# Patient Record
Sex: Female | Born: 1990
Health system: Southern US, Community
[De-identification: ages and names within clinical notes are randomized; demographics above are authoritative.]

## PROBLEM LIST (undated history)

## (undated) ENCOUNTER — Inpatient Hospital Stay: Payer: Self-pay

---

## 2015-06-11 ENCOUNTER — Other Ambulatory Visit: Payer: Self-pay | Admitting: Family Medicine

## 2015-06-11 DIAGNOSIS — R319 Hematuria, unspecified: Secondary | ICD-10-CM

## 2015-06-20 ENCOUNTER — Other Ambulatory Visit: Payer: Self-pay

## 2015-06-30 ENCOUNTER — Ambulatory Visit
Admission: RE | Admit: 2015-06-30 | Discharge: 2015-06-30 | Disposition: A | Discharge status: Home / Self Care | Payer: BLUE CROSS/BLUE SHIELD | Source: Ambulatory Visit | Attending: Family Medicine | Admitting: Family Medicine

## 2015-06-30 DIAGNOSIS — R319 Hematuria, unspecified: Secondary | ICD-10-CM

## 2015-12-11 IMAGING — CT CT ABD-PELV W/O CM
3 of 4 series · 8 of 46 positions shown, 15 images · non-contrast
Comparison: None.

CLINICAL DATA: Left lower back pain and rt flank pain Hematuria,
intermittently since 05/26/15. Mid supra pubic pain. Symptoms X 1
month. No surg. No hx stones

EXAM:
CT ABDOMEN AND PELVIS WITHOUT CONTRAST
TECHNIQUE: Multidetector CT imaging of the abdomen and pelvis was performed
following the standard protocol without IV contrast.

[Series 3: cor · coronal · 0.69mm/px · 3 of 59 slices shown, 4 images]
[im 20/59  soft-tissue]
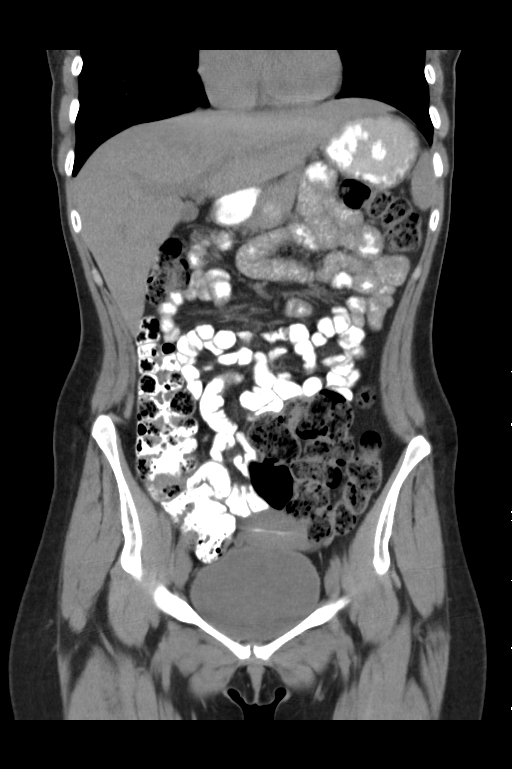
[im 26/59  soft-tissue]
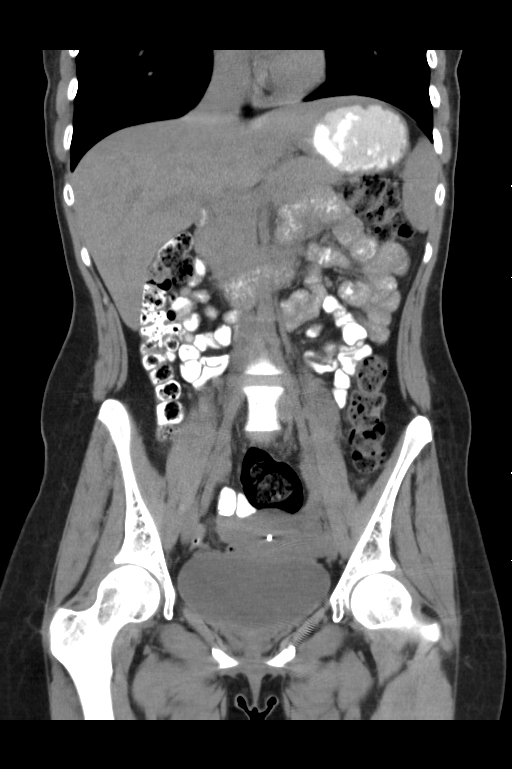
[im 26/59  bone]
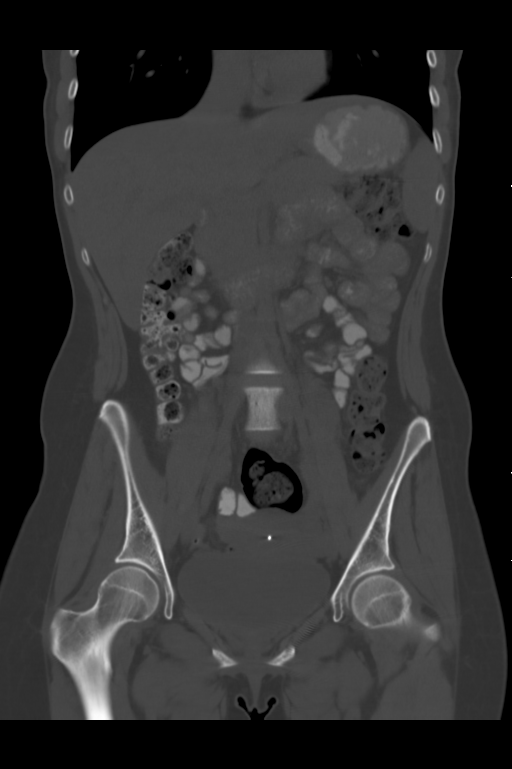
[im 33/59  soft-tissue]
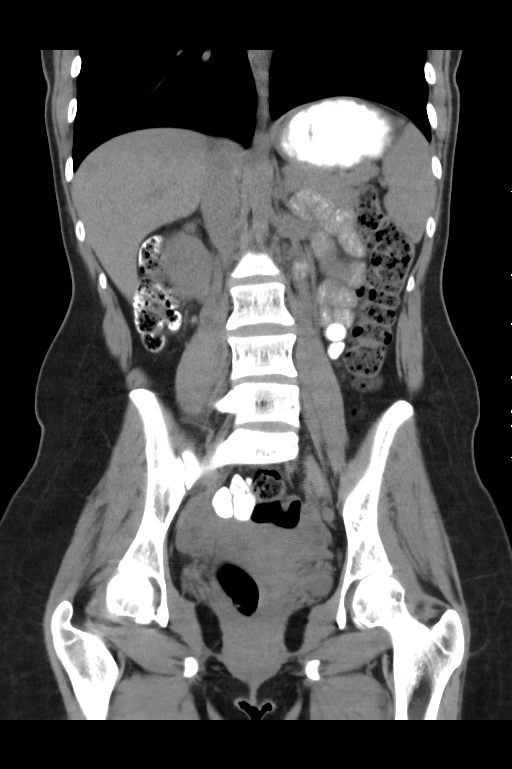

[Series 4: sag · sagittal · 0.51mm/px · 1 of 97 slices shown, 2 images]
[im 33/97  soft-tissue]
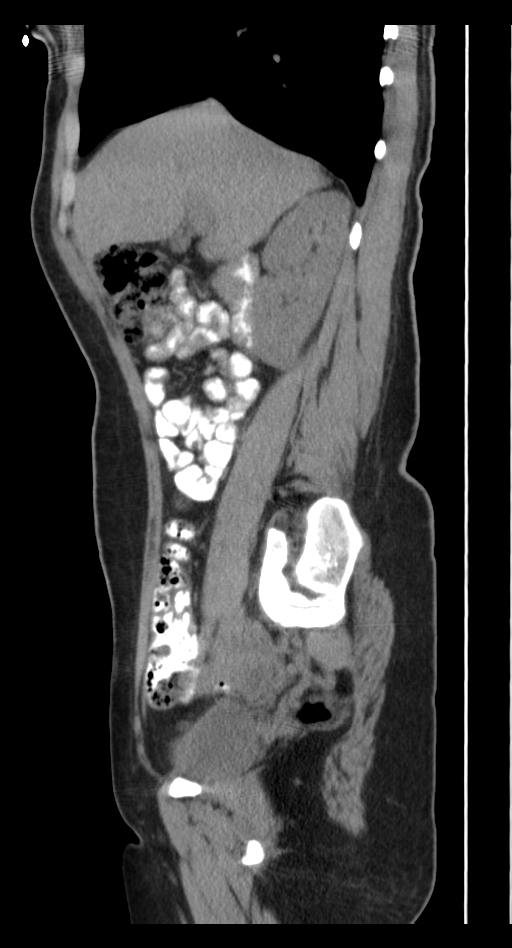
[im 33/97  bone]
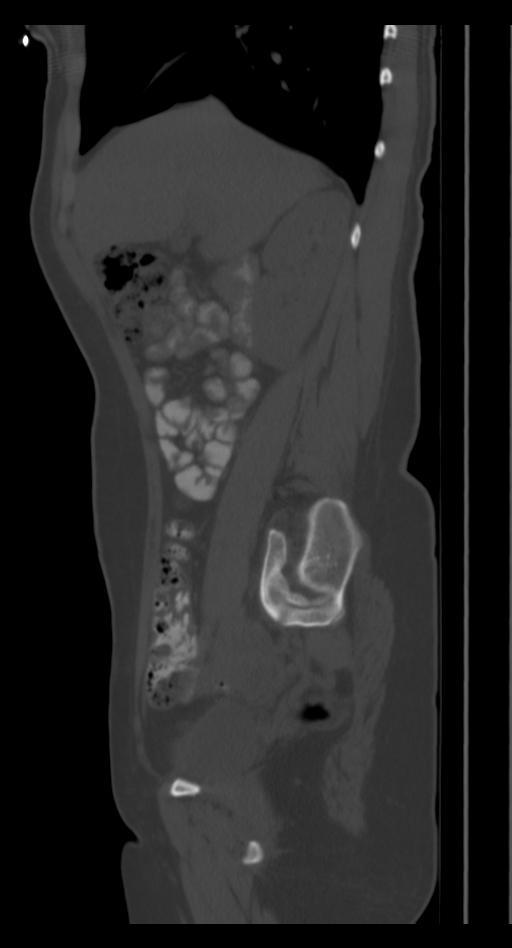

[Series 5: lung · axial · 0.66mm/px · z∈[-131,-71]mm · 4 of 20 slices shown, 9 images]
[im 4/20  soft-tissue]
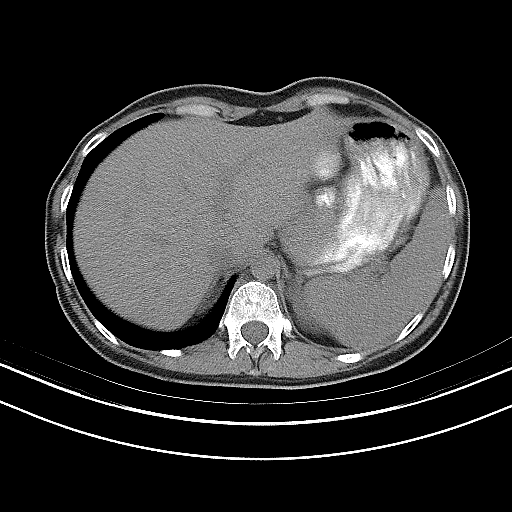
[im 4/20  lung]
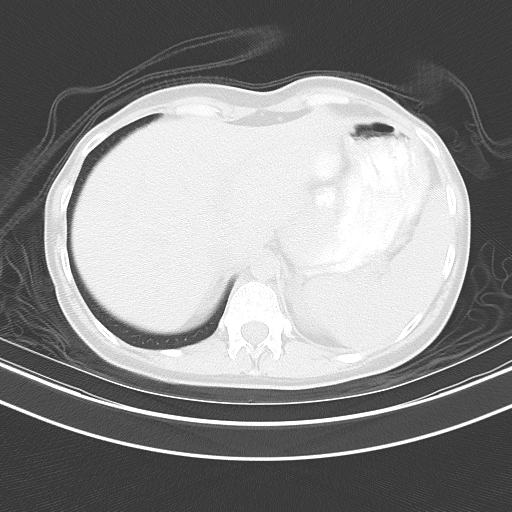
[im 4/20  bone]
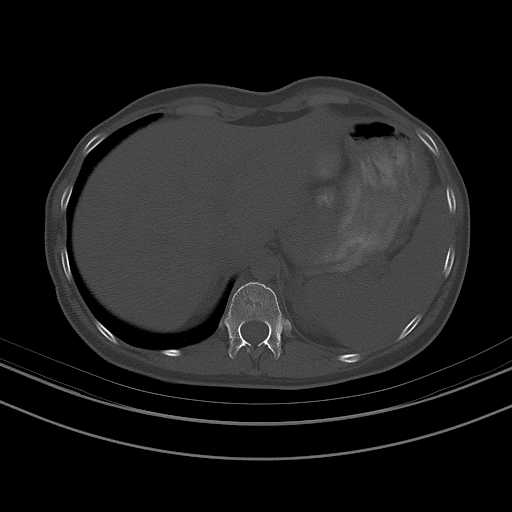
[im 8/20  soft-tissue]
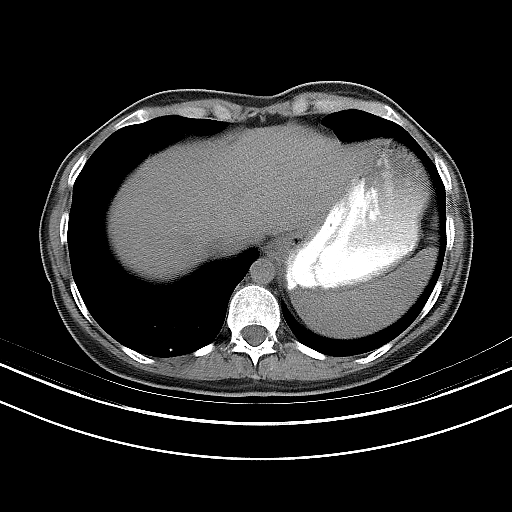
[im 8/20  lung]
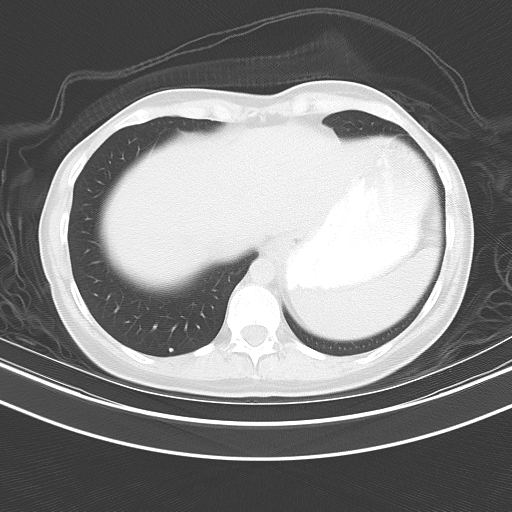
[im 12/20  soft-tissue]
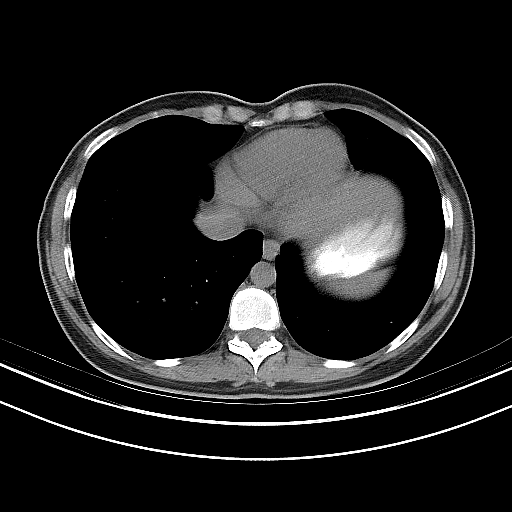
[im 12/20  lung]
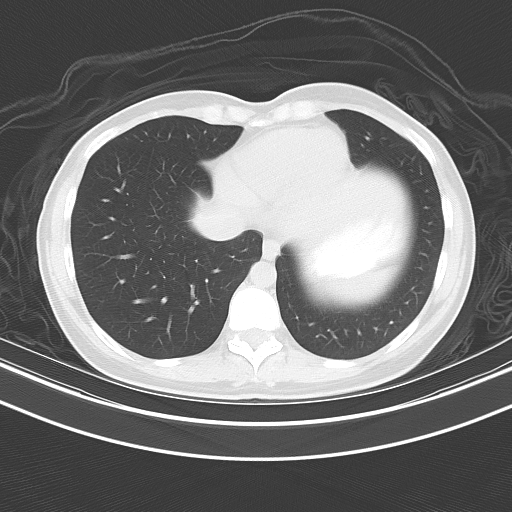
[im 16/20  soft-tissue]
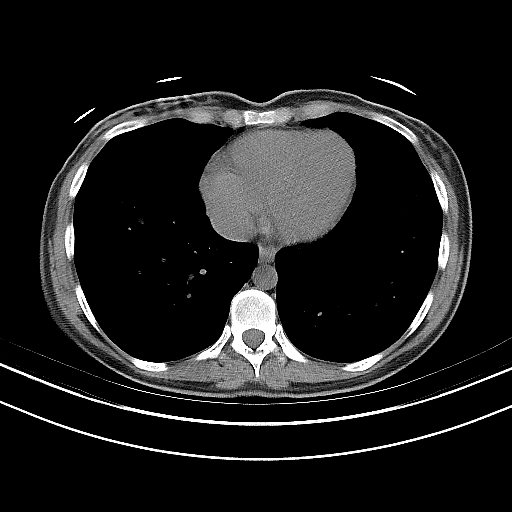
[im 16/20  lung]
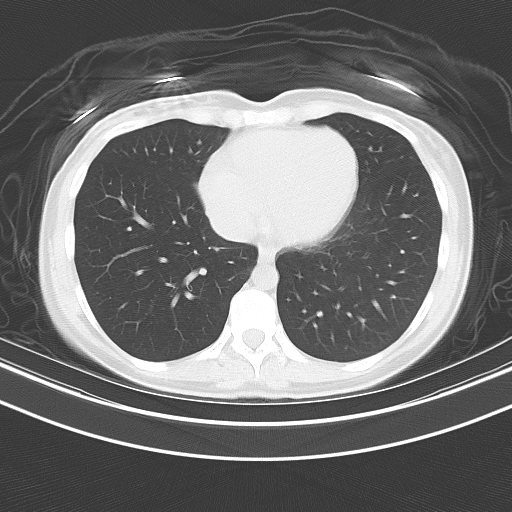

[8 of 46 positions shown; findings below may reference images not displayed]

FINDINGS: Lung bases: Small calcified granuloma in the right lower lobe. Lung
bases otherwise clear. Heart normal in size.

Liver, spleen, gallbladder, pancreas, adrenal glands:  Normal.

Kidneys, ureters, bladder: Normal. No renal or ureteral stones. No
evidence of obstructive uropathy.

Uterus and adnexa: Well-positioned IUD in the uterus. No uterine
masses. No adnexal masses.

Lymph nodes:  No adenopathy.

Ascites:  None.

Gastrointestinal: Mild generalized increased stool noted throughout
the colon and in the rectum. No colonic wall thickening or
inflammatory changes. Normal stomach and small bowel. Normal
appendix visualized.

Musculoskeletal:  Unremarkable.
IMPRESSION: 1. No acute findings.
2. No evidence of renal or ureteral stones or obstructive uropathy.
3. Mild generalized increased stool noted throughout the colon and
in the rectum. Otherwise unremarkable.

## 2017-11-17 ENCOUNTER — Other Ambulatory Visit: Payer: Self-pay | Admitting: Nurse Practitioner

## 2017-11-17 ENCOUNTER — Other Ambulatory Visit (HOSPITAL_COMMUNITY)
Admission: RE | Admit: 2017-11-17 | Discharge: 2017-11-17 | Disposition: A | Discharge status: Home / Self Care | Payer: BLUE CROSS/BLUE SHIELD | Source: Ambulatory Visit | Attending: Obstetrics and Gynecology | Admitting: Obstetrics and Gynecology

## 2017-11-17 DIAGNOSIS — Z01419 Encounter for gynecological examination (general) (routine) without abnormal findings: Secondary | ICD-10-CM | POA: Diagnosis present

## 2017-11-22 LAB — CYTOLOGY - PAP: DIAGNOSIS: NEGATIVE

## 2017-11-29 ENCOUNTER — Ambulatory Visit (HOSPITAL_COMMUNITY)
Admission: EM | Admit: 2017-11-29 | Discharge: 2017-11-29 | Disposition: A | Discharge status: Home / Self Care | Payer: BLUE CROSS/BLUE SHIELD | Attending: Family Medicine | Admitting: Family Medicine

## 2017-11-29 ENCOUNTER — Encounter (HOSPITAL_COMMUNITY): Payer: Self-pay | Admitting: Emergency Medicine

## 2017-11-29 DIAGNOSIS — S61012A Laceration without foreign body of left thumb without damage to nail, initial encounter: Secondary | ICD-10-CM

## 2017-11-29 DIAGNOSIS — Z23 Encounter for immunization: Secondary | ICD-10-CM

## 2017-11-29 DIAGNOSIS — W260XXA Contact with knife, initial encounter: Secondary | ICD-10-CM

## 2017-11-29 DIAGNOSIS — R55 Syncope and collapse: Secondary | ICD-10-CM | POA: Diagnosis not present

## 2017-11-29 MED ORDER — TETANUS-DIPHTH-ACELL PERTUSSIS 5-2.5-18.5 LF-MCG/0.5 IM SUSP
INTRAMUSCULAR | Status: AC
  Filled 2017-11-29: qty 0.5

## 2017-11-29 MED ORDER — LIDOCAINE-EPINEPHRINE (PF) 2 %-1:200000 IJ SOLN
INTRAMUSCULAR | Status: AC
  Filled 2017-11-29: qty 20

## 2017-11-29 MED ORDER — TETANUS-DIPHTH-ACELL PERTUSSIS 5-2.5-18.5 LF-MCG/0.5 IM SUSP
0.5000 mL | Freq: Once | INTRAMUSCULAR | Status: AC
  Administered 2017-11-29: 0.5 mL via INTRAMUSCULAR

## 2017-11-29 NOTE — ED Triage Notes (Signed)
Pt c/o hand laceration today on L hand, cut it with a knife, pt states she passed out due to seeing the blood. Hand is wrapped.

## 2017-11-29 NOTE — ED Provider Notes (Signed)
[facility]   [id number] 11/29/17 Arrival Time: 1425   SUBJECTIVE:  Amber Herrera is a 27 y.o. female who presents to the urgent care with complaint of hand laceration today on L hand, cut it with a knife, pt states she passed out due to seeing the blood. Hand is wrapped.    History reviewed. No pertinent past medical history. No family history on file. Social History   Socioeconomic History  . Marital status: Married    Spouse name: Not on file  . Number of children: Not on file  . Years of education: Not on file  . Highest education level: Not on file  Occupational History  . Not on file  Social Needs  . Financial resource strain: Not on file  . Food insecurity:    Worry: Not on file    Inability: Not on file  . Transportation needs:    Medical: Not on file    Non-medical: Not on file  Tobacco Use  . Smoking status: Never Smoker  Substance and Sexual Activity  . Alcohol use: Never    Frequency: Never  . Drug use: Never  . Sexual activity: Not on file  Lifestyle  . Physical activity:    Days per week: Not on file    Minutes per session: Not on file  . Stress: Not on file  Relationships  . Social connections:    Talks on phone: Not on file    Gets together: Not on file    Attends religious service: Not on file    Active member of club or organization: Not on file    Attends meetings of clubs or organizations: Not on file    Relationship status: Not on file  . Intimate partner violence:    Fear of current or ex partner: Not on file    Emotionally abused: Not on file    Physically abused: Not on file    Forced sexual activity: Not on file  Other Topics Concern  . Not on file  Social History Narrative  . Not on file   No outpatient medications have been marked as taking for the 11/29/17 encounter [facility] Encounter).   No Known Allergies    ROS: As per HPI, remainder of ROS negative.   OBJECTIVE:   Vitals:   11/29/17 1439  BP: 108/75    Pulse: 60  Resp: 18  Temp: 97.6 F (36.4 C)  SpO2: 100%     General appearance: alert; no distress Eyes: PERRL; EOMI; conjunctiva normal HENT: normocephalic; atraumatic; oral mucosa normal Neck: supple Extremities: no cyanosis or edema; symmetrical with no gross deformities Skin: warm and dry, 2 cm laceration volar base of thumb on thenar eminence Neurologic: normal gait; grossly normal Psychological: alert and cooperative; normal mood and affect      Labs:  Results for orders placed or performed in visit on 11/17/17  Cytology - PAP  Result Value Ref Range   Adequacy      Satisfactory for evaluation  endocervical/transformation zone component PRESENT.   Diagnosis      NEGATIVE FOR INTRAEPITHELIAL LESIONS OR MALIGNANCY. BENIGN REACTIVE/REPARATIVE CHANGES.   Material Submitted CervicoVaginal Pap [ThinPrep Imaged]     Labs Reviewed - No data to display  No results found.     ASSESSMENT & PLAN:  1. Laceration of left thumb without foreign body without damage to nail, initial encounter    Return for suture removal here or your doctor in 1 week.    Keep  the wound dry until tomorrow, then wash as you normally would.  Return for increasing pain, swelling or redness. Meds ordered this encounter  Medications  . Tdap (BOOSTRIX) injection 0.5 mL    Reviewed expectations re: course of current medical issues. Questions answered. Outlined signs and symptoms indicating need for more acute intervention. Patient verbalized understanding. After Visit Summary given.    Procedures:  Laceration site was prepped with Betadine and then anesthetized with 1% Xylocaine and epinephrine.  Wound was closed with two 4-0 Prolene horizontal mattress sutures and then a sterile dressing was applied.  There were no complications     Elvina Sidle, MD 11/29/17 [phone]

## 2017-11-29 NOTE — Discharge Instructions (Addendum)
Return for suture removal here or your doctor in 1 week.    Keep the wound dry until tomorrow, then wash as you normally would.  Return for increasing pain, swelling or redness.

## 2017-12-06 ENCOUNTER — Ambulatory Visit (HOSPITAL_COMMUNITY)
Admission: EM | Admit: 2017-12-06 | Discharge: 2017-12-06 | Disposition: A | Discharge status: Home / Self Care | Payer: BLUE CROSS/BLUE SHIELD

## 2017-12-06 ENCOUNTER — Encounter (HOSPITAL_COMMUNITY): Payer: Self-pay | Admitting: Emergency Medicine

## 2017-12-06 DIAGNOSIS — Z4802 Encounter for removal of sutures: Secondary | ICD-10-CM

## 2017-12-06 DIAGNOSIS — S61012D Laceration without foreign body of left thumb without damage to nail, subsequent encounter: Secondary | ICD-10-CM | POA: Diagnosis not present

## 2017-12-06 NOTE — ED Triage Notes (Signed)
Pt here for suture removal from left hand; pt noted some numbness to thumb and index and middle finger at times while sleeping and pain; bruising noted to palm of hand; KL saw pt to advise

## 2018-01-30 ENCOUNTER — Emergency Department (HOSPITAL_COMMUNITY)
Admission: EM | Admit: 2018-01-30 | Discharge: 2018-01-30 | Disposition: A | Discharge status: Home / Self Care | Payer: BLUE CROSS/BLUE SHIELD | Attending: Emergency Medicine | Admitting: Emergency Medicine

## 2018-01-30 ENCOUNTER — Encounter (HOSPITAL_COMMUNITY): Payer: Self-pay | Admitting: Emergency Medicine

## 2018-01-30 ENCOUNTER — Other Ambulatory Visit: Payer: Self-pay

## 2018-01-30 DIAGNOSIS — H60332 Swimmer's ear, left ear: Secondary | ICD-10-CM

## 2018-01-30 DIAGNOSIS — H9203 Otalgia, bilateral: Secondary | ICD-10-CM | POA: Diagnosis present

## 2018-01-30 DIAGNOSIS — H66003 Acute suppurative otitis media without spontaneous rupture of ear drum, bilateral: Secondary | ICD-10-CM | POA: Insufficient documentation

## 2018-01-30 MED ORDER — OFLOXACIN 0.3 % OP SOLN
5.0000 [drp] | Freq: Every day | OPHTHALMIC | Status: DC
  Administered 2018-01-30: 5 [drp] via OTIC
  Filled 2018-01-30: qty 5

## 2018-01-30 MED ORDER — AMOXICILLIN-POT CLAVULANATE 875-125 MG PO TABS: 1.0000 | ORAL_TABLET | Freq: Two times a day (BID) | ORAL | 0 refills | Status: AC

## 2018-01-30 MED ORDER — AMOXICILLIN-POT CLAVULANATE 875-125 MG PO TABS
1.0000 | ORAL_TABLET | Freq: Once | ORAL | Status: AC
  Administered 2018-01-30: 1 via ORAL
  Filled 2018-01-30: qty 1

## 2018-01-30 MED ORDER — IBUPROFEN 600 MG PO TABS: 600.0000 mg | ORAL_TABLET | Freq: Four times a day (QID) | ORAL | 0 refills | Status: DC | PRN

## 2018-01-30 MED ORDER — ACETAMINOPHEN 500 MG PO TABS
1000.0000 mg | ORAL_TABLET | Freq: Once | ORAL | Status: AC
  Administered 2018-01-30: 1000 mg via ORAL
  Filled 2018-01-30: qty 2

## 2018-01-30 NOTE — ED Triage Notes (Signed)
Pt states she flew in from [place] @ 7 hrs ago, pt c/o bilat ear pressure and ringing.  Worse last 3 hours, unrelieved by PTC ,meds

## 2018-01-30 NOTE — ED Provider Notes (Signed)
[facility] EMERGENCY DEPARTMENT Provider Note  CSN: [id number] Arrival date & time: 01/30/18 0104  Chief Complaint(s) Ear Fullness  HPI Amber Herrera is a 27 y.o. female who presents to the emergency department with bilateral otalgia that began approximately 7 hours prior to arrival.  Patient reports that she returned from a trip to [place] yesterday evening and noted that the pain began while on the flight and gradually worsened since onset.  Patient endorses bilateral hearing difficulty.  There is no alleviating or aggravating factors.  She is tried taking 400 mg of Motrin without relief.  Denies any fevers but endorses chills.  No rhinorrhea or cough.  No nuchal rigidity.  No headache.  Denies any other physical complaints.  Patient reports that they went swimming at the beach yesterday.  Of note patient was seen in the pediatric ED and diagnosed with a left otitis media.  HPI  Past Medical History History reviewed. No pertinent past medical history. There are no active problems to display for this patient.  Home Medication(s) Prior to Admission medications   Medication Sig Start Date End Date Taking? Authorizing Provider  amoxicillin-clavulanate (AUGMENTIN) 875-125 MG tablet Take 1 tablet by mouth 2 (two) times daily for 10 days. 01/30/18 02/09/18  Nira Conn, MD  ibuprofen (ADVIL,MOTRIN) 600 MG tablet Take 1 tablet (600 mg total) by mouth every 6 (six) hours as needed. 01/30/18   Nira Conn, MD                                                                                                                                    Past Surgical History History reviewed. No pertinent surgical history. Family History No family history on file.  Social History Social History   Tobacco Use  . Smoking status: Never Smoker  . Smokeless tobacco: Never Used  Substance Use Topics  . Alcohol use: Never    Frequency: Never  . Drug use: Never    Allergies Patient has no known allergies.  Review of Systems Review of Systems All other systems are reviewed and are negative for acute change except as noted in the HPI  Physical Exam Vital Signs  I have reviewed the triage vital signs BP 115/69 (BP Location: Right Arm)   Pulse 94   Temp 97.9 F (36.6 C) (Oral)   Resp 18   SpO2 97%   Physical Exam  Constitutional: She is oriented to person, place, and time. She appears well-developed and well-nourished. No distress.  HENT:  Head: Normocephalic and atraumatic.  Right Ear: No mastoid tenderness. Tympanic membrane is erythematous and bulging. A middle ear effusion is present.  Left Ear: No mastoid tenderness. Tympanic membrane is erythematous and bulging. A middle ear effusion is present.  Nose: Nose normal.  Granulated tissue and left external canal.  Eyes: Pupils are equal, round, and reactive to light. Conjunctivae and EOM are normal. Right eye exhibits no  discharge. Left eye exhibits no discharge. No scleral icterus.  Neck: Normal range of motion. Neck supple.  Cardiovascular: Normal rate and regular rhythm. Exam reveals no gallop and no friction rub.  No murmur heard. Pulmonary/Chest: Effort normal and breath sounds normal. No stridor. No respiratory distress. She has no rales.  Abdominal: Soft. She exhibits no distension. There is no tenderness.  Musculoskeletal: She exhibits no edema or tenderness.  Neurological: She is alert and oriented to person, place, and time.  Skin: Skin is warm and dry. No rash noted. She is not diaphoretic. No erythema.  Psychiatric: She has a normal mood and affect.  Vitals reviewed.   ED Results and Treatments Labs (all labs ordered are listed, but only abnormal results are displayed) Labs Reviewed - No data to display                                                                                                                       EKG  EKG Interpretation  Date/Time:     Ventricular Rate:    PR Interval:    QRS Duration:   QT Interval:    QTC Calculation:   R Axis:     Text Interpretation:        Radiology No results found. Pertinent labs & imaging results that were available during my care of the patient were reviewed by me and considered in my medical decision making (see chart for details).  Medications Ordered in ED Medications  ofloxacin (OCUFLOX) 0.3 % ophthalmic solution 5 drop (has no administration in time range)  acetaminophen (TYLENOL) tablet 1,000 mg (1,000 mg Oral Given 01/30/18 0223)  amoxicillin-clavulanate (AUGMENTIN) 875-125 MG per tablet 1 tablet (1 tablet Oral Given 01/30/18 0223)                                                                                                                                    Procedures Procedures  (including critical care time)  Medical Decision Making / ED Course I have reviewed the nursing notes for this encounter and the patient's prior records (if available in EHR or on provided paperwork).    Presentation consistent with bilateral otitis media as well as otitis externa of the left.  No mastoid tenderness concerning for mastoiditis.  No nuchal rigidity or headache concerning for meningitis.  The patient appears reasonably screened and/or stabilized for discharge and I doubt any other medical condition  or other [facility] requiring further screening, evaluation, or treatment in the ED at this time prior to discharge.  The patient is safe for discharge with strict return precautions.   Final Clinical Impression(s) / ED Diagnoses Final diagnoses:  Non-recurrent acute suppurative otitis media of both ears without spontaneous rupture of tympanic membranes  Acute swimmer's ear of left side    Disposition: Discharge  Condition: Good  I have discussed the results, Dx and Tx plan with the patient who expressed understanding and agree(s) with the plan. Discharge instructions discussed at great  length. The patient was given strict return precautions who verbalized understanding of the instructions. No further questions at time of discharge.    ED Discharge Orders        Ordered    amoxicillin-clavulanate (AUGMENTIN) 875-125 MG tablet  2 times daily     01/30/18 0228    ibuprofen (ADVIL,MOTRIN) 600 MG tablet  Every 6 hours PRN     01/30/18 0228       Follow Up: Primary care provider   in 5-7 days, If symptoms do not improve or  worsen     This chart was dictated using voice recognition software.  Despite best efforts to proofread,  errors can occur which can change the documentation meaning.   Nira Connardama, Trinidad Ingle Eduardo, MD 01/30/18 [phone]

## 2018-04-18 LAB — OB RESULTS CONSOLE HGB/HCT, BLOOD
HEMATOCRIT: 37
HEMOGLOBIN: 12.2

## 2018-04-18 LAB — OB RESULTS CONSOLE ABO/RH: RH Type: POSITIVE

## 2018-04-18 LAB — OB RESULTS CONSOLE GC/CHLAMYDIA
Chlamydia: NEGATIVE
GC PROBE AMP, GENITAL: NEGATIVE

## 2018-04-18 LAB — OB RESULTS CONSOLE HIV ANTIBODY (ROUTINE TESTING): HIV: NONREACTIVE

## 2018-04-18 LAB — HIV ANTIBODY (ROUTINE TESTING W REFLEX)

## 2018-04-18 LAB — OB RESULTS CONSOLE TSH: TSH: 0.284

## 2018-04-18 LAB — OB RESULTS CONSOLE RPR: RPR: NONREACTIVE

## 2018-04-18 LAB — OB RESULTS CONSOLE PLATELET COUNT: Platelets: 284

## 2018-04-18 LAB — OB RESULTS CONSOLE HEPATITIS B SURFACE ANTIGEN: Hepatitis B Surface Ag: NEGATIVE

## 2018-04-18 LAB — OB RESULTS CONSOLE ANTIBODY SCREEN: Antibody Screen: NEGATIVE

## 2018-04-18 LAB — OB RESULTS CONSOLE RUBELLA ANTIBODY, IGM: RUBELLA: IMMUNE

## 2018-04-21 LAB — OB RESULTS CONSOLE VARICELLA ZOSTER ANTIBODY, IGG: Varicella: IMMUNE

## 2018-05-04 ENCOUNTER — Ambulatory Visit (INDEPENDENT_AMBULATORY_CARE_PROVIDER_SITE_OTHER): Payer: BLUE CROSS/BLUE SHIELD | Admitting: Certified Nurse Midwife

## 2018-05-04 ENCOUNTER — Encounter: Payer: Self-pay | Admitting: Certified Nurse Midwife

## 2018-05-04 VITALS — BP 106/72 | HR 86 | Wt 111.2 lb

## 2018-05-04 DIAGNOSIS — Z3491 Encounter for supervision of normal pregnancy, unspecified, first trimester: Secondary | ICD-10-CM

## 2018-05-04 LAB — POCT URINE PREGNANCY: Preg Test, Ur: POSITIVE — AB

## 2018-05-04 NOTE — Patient Instructions (Signed)
WHAT OB PATIENTS CAN EXPECT   Confirmation of pregnancy and ultrasound ordered if medically indicated-[redacted] weeks gestation  New OB (NOB) intake with nurse and New OB (NOB) labs- [redacted] weeks gestation  New OB (NOB) physical examination with provider- 11/[redacted] weeks gestation  Flu vaccine-[redacted] weeks gestation  Anatomy scan-[redacted] weeks gestation  Glucose tolerance test, blood work to test for anemia, T-dap vaccine-[redacted] weeks gestation  Vaginal swabs/cultures-STD/Group B strep-[redacted] weeks gestation  Appointments every 4 weeks until 28 weeks  Every 2 weeks from 28 weeks until 36 weeks  Weekly visits from 36 weeks until delivery  Morning Sickness Morning sickness is when you feel sick to your stomach (nauseous) during pregnancy. You may feel sick to your stomach and throw up (vomit). You may feel sick in the morning, but you can feel this way any time of day. Some women feel very sick to their stomach and cannot stop throwing up (hyperemesis gravidarum). Follow these instructions at home:  Only take medicines as told by your doctor.  Take multivitamins as told by your doctor. Taking multivitamins before getting pregnant can stop or lessen the harshness of morning sickness.  Eat dry toast or unsalted crackers before getting out of bed.  Eat 5 to 6 small meals a day.  Eat dry and bland foods like rice and baked potatoes.  Do not drink liquids with meals. Drink between meals.  Do not eat greasy, fatty, or spicy foods.  Have someone cook for you if the smell of food causes you to feel sick or throw up.  If you feel sick to your stomach after taking prenatal vitamins, take them at night or with a snack.  Eat protein when you need a snack (nuts, yogurt, cheese).  Eat unsweetened gelatins for dessert.  Wear a bracelet used for sea sickness (acupressure wristband).  Go to a doctor that puts thin needles into certain body points (acupuncture) to improve how you feel.  Do not smoke.  Use a  humidifier to keep the air in your house free of odors.  Get lots of fresh air. Contact a doctor if:  You need medicine to feel better.  You feel dizzy or lightheaded.  You are losing weight. Get help right away if:  You feel very sick to your stomach and cannot stop throwing up.  You pass out (faint). This information is not intended to replace advice given to you by your health care provider. Make sure you discuss any questions you have with your health care provider. Document Released: 08/26/2004 Document Revised: 12/25/2015 Document Reviewed: 01/03/2013 Elsevier Interactive Patient Education  2017 [place]. Back Pain in Pregnancy Back pain during pregnancy is common. Back pain may be caused by several factors that are related to changes during your pregnancy. Follow these instructions at home: Managing pain, stiffness, and swelling  If directed, apply ice for sudden (acute) back pain. ? Put ice in a plastic bag. ? Place a towel between your skin and the bag. ? Leave the ice on for 20 minutes, 2-3 times per day.  If directed, apply heat to the affected area before you exercise: ? Place a towel between your skin and the heat pack or heating pad. ? Leave the heat on for 20-30 minutes. ? Remove the heat if your skin turns bright red. This is especially important if you are unable to feel pain, heat, or cold. You may have a greater risk of getting burned. Activity  Exercise as told by your health care provider.  Exercising is the best way to prevent or manage back pain.  Listen to your body when lifting. If lifting hurts, ask for help or bend your knees. This uses your leg muscles instead of your back muscles.  Squat down when picking up something from the floor. Do not bend over.  Only use bed rest as told by your health care provider. Bed rest should only be used for the most severe episodes of back pain. Standing, Sitting, and Lying Down  Do not stand in one place for  long periods of time.  Use good posture when sitting. Make sure your head rests over your shoulders and is not hanging forward. Use a pillow on your lower back if necessary.  Try sleeping on your side, preferably the left side, with a pillow or two between your legs. If you are sore after a night's rest, your bed may be too soft. A firm mattress may provide more support for your back during pregnancy. General instructions  Do not wear high heels.  Eat a healthy diet. Try to gain weight within your health care provider's recommendations.  Use a maternity girdle, elastic sling, or back brace as told by your health care provider.  Take over-the-counter and prescription medicines only as told by your health care provider.  Keep all follow-up visits as told by your health care provider. This is important. This includes any visits with any specialists, such as a physical therapist. Contact a health care provider if:  Your back pain interferes with your daily activities.  You have increasing pain in other parts of your body. Get help right away if:  You develop numbness, tingling, weakness, or problems with the use of your arms or legs.  You develop severe back pain that is not controlled with medicine.  You have a sudden change in bowel or bladder control.  You develop shortness of breath, dizziness, or you faint.  You develop nausea, vomiting, or sweating.  You have back pain that is a rhythmic, cramping pain similar to labor pains. Labor pain is usually 1-2 minutes apart, lasts for about 1 minute, and involves a bearing down feeling or pressure in your pelvis.  You have back pain and your water breaks or you have vaginal bleeding.  You have back pain or numbness that travels down your leg.  Your back pain developed after you fell.  You develop pain on one side of your back.  You see blood in your urine.  You develop skin blisters in the area of your back pain. This  information is not intended to replace advice given to you by your health care provider. Make sure you discuss any questions you have with your health care provider. Document Released: 10/27/2005 Document Revised: 12/25/2015 Document Reviewed: 04/02/2015 Elsevier Interactive Patient Education  2018 [employer]. Common Medications Safe in Pregnancy  Acne:      Constipation:  Benzoyl Peroxide     Colace  Clindamycin      Dulcolax Suppository  Topica Erythromycin     Fibercon  Salicylic Acid      Metamucil         Miralax AVOID:        Senakot   Accutane    Cough:  Retin-A       Cough Drops  Tetracycline      Phenergan w/ Codeine if Rx  Minocycline      Robitussin (Plain & DM)  Antibiotics:     Crabs/Lice:  Ceclor  RID  Cephalosporins    AVOID:  E-Mycins      Kwell  Keflex  Macrobid/Macrodantin   Diarrhea:  Penicillin      Kao-Pectate  Zithromax      Imodium AD         PUSH FLUIDS AVOID:       Cipro     Fever:  Tetracycline      Tylenol (Regular or Extra  Minocycline       Strength)  Levaquin      Extra Strength-Do not          Exceed 8 tabs/24 hrs Caffeine:        <255m/day (equiv. To 1 cup of coffee or  approx. 3 12 oz sodas)         Gas: Cold/Hayfever:       Gas-X  Benadryl      Mylicon  Claritin       Phazyme  **Claritin-D        Chlor-Trimeton    Headaches:  Dimetapp      ASA-Free Excedrin  Drixoral-Non-Drowsy     Cold Compress  Mucinex (Guaifenasin)     Tylenol (Regular or Extra  Sudafed/Sudafed-12 Hour     Strength)  **Sudafed PE Pseudoephedrine   Tylenol Cold & Sinus     Vicks Vapor Rub  Zyrtec  **AVOID if Problems With Blood Pressure         Heartburn: Avoid lying down for at least 1 hour after meals  Aciphex      Maalox     Rash:  Milk of Magnesia     Benadryl    Mylanta       1% Hydrocortisone Cream  Pepcid  Pepcid Complete   Sleep Aids:  Prevacid      Ambien   Prilosec       Benadryl  Rolaids       Chamomile Tea  Tums (Limit  4/day)     Unisom  Zantac       Tylenol PM         Warm milk-add vanilla or  Hemorrhoids:       Sugar for taste  Anusol/Anusol H.C.  (RX: Analapram 2.5%)  Sugar Substitutes:  Hydrocortisone OTC     Ok in moderation  Preparation H      Tucks        Vaseline lotion applied to tissue with wiping    Herpes:     Throat:  Acyclovir      Oragel  Famvir  Valtrex     Vaccines:         Flu Shot Leg Cramps:       *Gardasil  Benadryl      Hepatitis A         Hepatitis B Nasal Spray:       Pneumovax  Saline Nasal Spray     Polio Booster         Tetanus Nausea:       Tuberculosis test or PPD  Vitamin B6 25 mg TID   AVOID:    Dramamine      *Gardasil  Emetrol       Live Poliovirus  Ginger Root 250 mg QID    MMR (measles, mumps &  High Complex Carbs @ Bedtime    rebella)  Sea Bands-Accupressure    Varicella (Chickenpox)  Unisom 1/2 tab TID     *No known complications           If  received before Pain:         Known pregnancy;   Darvocet       Resume series after  Lortab        Delivery  Percocet    Yeast:   Tramadol      Femstat  Tylenol 3      Gyne-lotrimin  Ultram       Monistat  Vicodin           MISC:         All Sunscreens           Hair Coloring/highlights          Insect Repellant's          (Including DEET)         Mystic Tans Second Trimester of Pregnancy The second trimester is from week 13 through week 28, month 4 through 6. This is often the time in pregnancy that you feel your best. Often times, morning sickness has lessened or quit. You may have more energy, and you may get hungry more often. Your unborn baby (fetus) is growing rapidly. At the end of the sixth month, he or she is about 9 inches long and weighs about 1 pounds. You will likely feel the baby move (quickening) between 18 and 20 weeks of pregnancy. Follow these instructions at home:  Avoid all smoking, herbs, and alcohol. Avoid drugs not approved by your doctor.  Do not use any tobacco products, including  cigarettes, chewing tobacco, and electronic cigarettes. If you need help quitting, ask your doctor. You may get counseling or other support to help you quit.  Only take medicine as told by your doctor. Some medicines are safe and some are not during pregnancy.  Exercise only as told by your doctor. Stop exercising if you start having cramps.  Eat regular, healthy meals.  Wear a good support bra if your breasts are tender.  Do not use hot tubs, steam rooms, or saunas.  Wear your seat belt when driving.  Avoid raw meat, uncooked cheese, and liter boxes and soil used by cats.  Take your prenatal vitamins.  Take 1500-2000 milligrams of calcium daily starting at the 20th week of pregnancy until you deliver your baby.  Try taking medicine that helps you poop (stool softener) as needed, and if your doctor approves. Eat more fiber by eating fresh fruit, vegetables, and whole grains. Drink enough fluids to keep your pee (urine) clear or pale yellow.  Take warm water baths (sitz baths) to soothe pain or discomfort caused by hemorrhoids. Use hemorrhoid cream if your doctor approves.  If you have puffy, bulging veins (varicose veins), wear support hose. Raise (elevate) your feet for 15 minutes, 3-4 times a day. Limit salt in your diet.  Avoid heavy lifting, wear low heals, and sit up straight.  Rest with your legs raised if you have leg cramps or low back pain.  Visit your dentist if you have not gone during your pregnancy. Use a soft toothbrush to brush your teeth. Be gentle when you floss.  You can have sex (intercourse) unless your doctor tells you not to.  Go to your doctor visits. Get help if:  You feel dizzy.  You have mild cramps or pressure in your lower belly (abdomen).  You have a nagging pain in your belly area.  You continue to feel sick to your stomach (nauseous), throw up (vomit), or have watery poop (diarrhea).  You have bad smelling fluid coming from  your  vagina.  You have pain with peeing (urination). Get help right away if:  You have a fever.  You are leaking fluid from your vagina.  You have spotting or bleeding from your vagina.  You have severe belly cramping or pain.  You lose or gain weight rapidly.  You have trouble catching your breath and have chest pain.  You notice sudden or extreme puffiness (swelling) of your face, hands, ankles, feet, or legs.  You have not felt the baby move in over an hour.  You have severe headaches that do not go away with medicine.  You have vision changes. This information is not intended to replace advice given to you by your health care provider. Make sure you discuss any questions you have with your health care provider. Document Released: 10/13/2009 Document Revised: 12/25/2015 Document Reviewed: 09/19/2012 Elsevier Interactive Patient Education  2017 [employer].

## 2018-05-04 NOTE — Progress Notes (Signed)
Amber Herrera presents for NOB nurse interview visit. Pregnancy confirmation done [facility] _9/17/19_____.  G-4 .  P- 3   . Pregnancy education material explained and given. _0__ cats in the home. NOB labs ordered.  HIV labs and Drug screen were explained optional and she did not decline. Drug screen ordered/declined. PNV encouraged. Genetic screening options discussed. Genetic testing:Declined.  Pt may discuss with provider. Pt. To follow up with provider in _4_ weeks for ROB.  All questions answered.  TRANSFER IN OB HISTORY AND PHYSICAL  SUBJECTIVE:       Amber Herrera is a 27 y.o. G1P0 female, Patient's last menstrual period was 02/13/2018 (exact date)., Estimated Date of Delivery: 11/13/18, [redacted]w[redacted]d, presents today for Transition of Prenatal Care.   EPIC data migration awaiting receipt of outside records.   Reports intermittent nausea with vomiting. Denies difficulty breathing or respiratory distress, chest pain, abdominal pain, vaginal bleeding, dysuria, and leg pain or swelling.   Accompanied by spouse and youngest daughter.   Gynecologic History  Patient's last menstrual period was 02/13/2018 (exact date).   Contraception: none   Last Pap: 03/2017. Results were: normal per patient report.   Obstetric History  OB History  Gravida Para Term Preterm AB Living  4 3 3     3   SAB TAB Ectopic Multiple Live Births          3    # Outcome Date GA Lbr Len/2nd Weight Sex Delivery Anes PTL Lv  4 Current           3 Term 2016    F Vag-Spont   LIV  2 Term 2012    F Vag-Spont   LIV  1 Term 2008    F Vag-Spont   LIV    Current Outpatient Medications on File Prior to Visit  Medication Sig Dispense Refill  . Prenatal Vit-Fe Fumarate-FA (MULTIVITAMIN-PRENATAL) 27-0.8 MG TABS tablet Take 1 tablet by mouth daily at 12 noon.    Marland Kitchen ibuprofen (ADVIL,MOTRIN) 600 MG tablet Take 1 tablet (600 mg total) by mouth every 6 (six) hours as needed. (Patient not taking: Reported on 05/04/2018) 30 tablet 0    No current facility-administered medications on file prior to visit.     No Known Allergies  Social History   Socioeconomic History  . Marital status: Married    Spouse name: Not on file  . Number of children: Not on file  . Years of education: Not on file  . Highest education level: Not on file  Occupational History  . Not on file  Social Needs  . Financial resource strain: Not on file  . Food insecurity:    Worry: Not on file    Inability: Not on file  . Transportation needs:    Medical: Not on file    Non-medical: Not on file  Tobacco Use  . Smoking status: Never Smoker  . Smokeless tobacco: Never Used  Substance and Sexual Activity  . Alcohol use: Never    Frequency: Never  . Drug use: Never  . Sexual activity: Not on file  Lifestyle  . Physical activity:    Days per week: Not on file    Minutes per session: Not on file  . Stress: Not on file  Relationships  . Social connections:    Talks on phone: Not on file    Gets together: Not on file    Attends religious service: Not on file    Active member of club or organization: Not on  file    Attends meetings of clubs or organizations: Not on file    Relationship status: Not on file  . Intimate partner violence:    Fear of current or ex partner: Not on file    Emotionally abused: Not on file    Physically abused: Not on file    Forced sexual activity: Not on file  Other Topics Concern  . Not on file  Social History Narrative  . Not on file   The following portions of the patient's history were reviewed and updated as appropriate: allergies, current medications, past OB history, past medical history, past surgical history, past family history, past social history, and problem list.    OBJECTIVE:  BP 106/72   Pulse 86   Wt 111 lb 3 oz (50.4 kg)   LMP 02/13/2018 (Exact Date)   Initial Physical Exam (New OB)  GENERAL APPEARANCE: alert, well appearing, in no apparent distress  HEAD: normocephalic,  atraumatic  MOUTH: mucous membranes moist, pharynx normal without lesions  THYROID: not examined  BREASTS: patient declined exam  LUNGS: clear to auscultation, no wheezes, rales or rhonchi, symmetric air entry  HEART: regular rate and rhythm, no murmurs  ABDOMEN: soft, nontender, nondistended, no abnormal masses, no epigastric pain and FHT present  EXTREMITIES: no edema  SKIN: normal coloration and turgor, no rashes  LYMPH NODES: not examined  NEUROLOGIC: alert, oriented, normal speech, no focal findings or movement disorder noted  PELVIC EXAM: deferred, no indicated  ASSESSMENT: Normal pregnancy Declines genetic screening Nausea and vomiting in pregnancy  PLAN: Prenatal care New OB counseling: The patient has been given an overview regarding routine prenatal care. Recommendations regarding diet, weight gain, and exercise in pregnancy were given. Prenatal testing, optional genetic testing, and ultrasound use in pregnancy were reviewed.  Benefits of Breast Feeding were discussed. The patient is encouraged to consider nursing her baby post partum. See orders   Gunnar Bulla, CNM [facility], [facility]

## 2018-05-07 ENCOUNTER — Encounter: Payer: Self-pay | Admitting: Certified Nurse Midwife

## 2018-05-07 MED ORDER — RANITIDINE HCL 150 MG PO CAPS: 150.0000 mg | ORAL_CAPSULE | Freq: Two times a day (BID) | ORAL | 1 refills | Status: DC

## 2018-05-07 MED ORDER — PYRIDOXINE HCL 25 MG PO TABS: 25.0000 mg | ORAL_TABLET | Freq: Three times a day (TID) | ORAL | 3 refills | Status: DC | PRN

## 2018-05-08 ENCOUNTER — Telehealth: Payer: Self-pay

## 2018-05-08 NOTE — Telephone Encounter (Signed)
Called patient to ask about MMR to be signed, pt said she filled out release form while she was here for appointment.  [place]. to reach out to [facility] to verify receipt of MMR.

## 2018-05-09 NOTE — Telephone Encounter (Signed)
Spoke with patient, states she will go to [facility] to sign release form tomorrow 10/9 and she is planning to continue care with [place] and d/c from [facility].  I also called [facility] to see if patient had filled out release form there since she is sure she filled out a release form "some where".  Linda at [facility] denies having release form there.

## 2018-05-30 ENCOUNTER — Ambulatory Visit (INDEPENDENT_AMBULATORY_CARE_PROVIDER_SITE_OTHER): Payer: BLUE CROSS/BLUE SHIELD | Admitting: Certified Nurse Midwife

## 2018-05-30 VITALS — BP 93/56 | HR 86

## 2018-05-30 DIAGNOSIS — Z3482 Encounter for supervision of other normal pregnancy, second trimester: Secondary | ICD-10-CM

## 2018-05-30 LAB — POCT URINALYSIS DIPSTICK OB
Bilirubin, UA: NEGATIVE
Blood, UA: NEGATIVE
GLUCOSE, UA: NEGATIVE
Ketones, UA: NEGATIVE
LEUKOCYTES UA: NEGATIVE
NITRITE UA: NEGATIVE
PROTEIN: NEGATIVE
Spec Grav, UA: 1.02 (ref 1.010–1.025)
Urobilinogen, UA: 0.2 E.U./dL
pH, UA: 6 (ref 5.0–8.0)

## 2018-05-30 NOTE — Patient Instructions (Signed)

## 2018-05-30 NOTE — Progress Notes (Signed)
ROB doing well . Complains of round ligament pain . Reassurance given. She is not feeling movement yet. Movement heard with doppler. Discussed anatomy u/s at next visit. She asks about traveling in pregnancy, she will be going out of town for Thanksgiving. Encourage PO hydration, snacks, and moving q 2-3 hrs. She verbalizes understanding. Follow up 4 wks for anatomy scan and ROB with Melody.   Doreene Burke, CNM

## 2018-06-13 ENCOUNTER — Other Ambulatory Visit: Payer: Self-pay | Admitting: *Deleted

## 2018-06-13 ENCOUNTER — Telehealth: Payer: Self-pay | Admitting: Certified Nurse Midwife

## 2018-06-13 MED ORDER — POLYETHYLENE GLYCOL 3350 17 G PO PACK: 17.0000 g | PACK | Freq: Every day | ORAL | 0 refills | Status: DC

## 2018-06-13 NOTE — Telephone Encounter (Signed)
Sent pt in some miralax

## 2018-06-13 NOTE — Telephone Encounter (Signed)
The patient called stating she is constipated and needs something called into the pharmacy please advise, thanks.

## 2018-06-21 ENCOUNTER — Encounter: Payer: Self-pay | Admitting: Certified Nurse Midwife

## 2018-06-21 ENCOUNTER — Ambulatory Visit (INDEPENDENT_AMBULATORY_CARE_PROVIDER_SITE_OTHER): Payer: BLUE CROSS/BLUE SHIELD | Admitting: Certified Nurse Midwife

## 2018-06-21 ENCOUNTER — Ambulatory Visit (INDEPENDENT_AMBULATORY_CARE_PROVIDER_SITE_OTHER): Payer: BLUE CROSS/BLUE SHIELD

## 2018-06-21 VITALS — BP 106/53 | HR 77 | Ht 66.0 in | Wt 123.8 lb

## 2018-06-21 DIAGNOSIS — Z3482 Encounter for supervision of other normal pregnancy, second trimester: Secondary | ICD-10-CM | POA: Diagnosis not present

## 2018-06-21 DIAGNOSIS — M549 Dorsalgia, unspecified: Secondary | ICD-10-CM

## 2018-06-21 DIAGNOSIS — Z23 Encounter for immunization: Secondary | ICD-10-CM

## 2018-06-21 DIAGNOSIS — O9989 Other specified diseases and conditions complicating pregnancy, childbirth and the puerperium: Secondary | ICD-10-CM

## 2018-06-21 DIAGNOSIS — Z3A19 19 weeks gestation of pregnancy: Secondary | ICD-10-CM

## 2018-06-21 LAB — POCT URINALYSIS DIPSTICK OB
Bilirubin, UA: NEGATIVE
Blood, UA: NEGATIVE
GLUCOSE, UA: NEGATIVE
Ketones, UA: NEGATIVE
LEUKOCYTES UA: NEGATIVE
Nitrite, UA: NEGATIVE
POC,PROTEIN,UA: NEGATIVE
SPEC GRAV UA: 1.01 (ref 1.010–1.025)
Urobilinogen, UA: 0.2 E.U./dL
pH, UA: 5 (ref 5.0–8.0)

## 2018-06-21 MED ORDER — POLYETHYLENE GLYCOL 3350 17 GM/SCOOP PO POWD: 1.0000 | Freq: Once | ORAL | 0 refills | Status: AC

## 2018-06-21 MED ORDER — RANITIDINE HCL 150 MG PO TABS: 150.0000 mg | ORAL_TABLET | Freq: Two times a day (BID) | ORAL | 0 refills | Status: DC

## 2018-06-21 MED ORDER — PYRIDOXINE HCL 25 MG PO TABS: 25.0000 mg | ORAL_TABLET | Freq: Four times a day (QID) | ORAL | 3 refills | Status: DC | PRN

## 2018-06-21 NOTE — Progress Notes (Signed)
ROB doing well. Anatomy scan today incomplete, questionable 2 vessel cord vs 3 vessel cord . Additional scanning needed to get complete pictures of RBOT and cord. Pt verbalizes understanding and agrees to plan. To follow up 2 wk for u/s and 4 wk for ROB. Pt has back pain. States she had bad back pain with last pregnancy and states it is starting . Encouraged belly band and use of tylenol. She request referral to chiropractor. Order placed. ROB in 4 wks.   Doreene BurkeAnnie Dahiana Kulak, CNM

## 2018-06-21 NOTE — Patient Instructions (Signed)

## 2018-06-21 NOTE — Progress Notes (Signed)
Patient Name: Vance GatherMarwa Landino DOB: 1990/09/28 MRN: [id number]  ULTRASOUND REPORT  Location: [facility] Date of Service: 06/21/2018   Indications:Anatomy Ultrasound Findings:  Mason JimSingleton intrauterine pregnancy is visualized with FHR at 144 BPM. Biometrics give an (U/S) Gestational age of 3742w3d and an (U/S) EDD of 11/12/18; this correlates with the clinically established Estimated Date of Delivery: 11/13/18  Fetal presentation is Cephalic.  EFW: wnl. Placenta: posterior. Grade: 0 AFI: subjectively normal.  Anatomic survey is incomplete for RVOT,3VC (possibility of 2VC) d/t fetal position dnd normal; Gender - female.    Right Ovary is normal in appearance. Left Ovary is normal appearance. Survey of the adnexa demonstrates no adnexal masses. There is no free peritoneal fluid in the cul de sac.  Impression: 1. 2144w2d Viable Singleton Intrauterine pregnancy by U/S. 2. (U/S) EDD is consistent with Clinically established Estimated Date of Delivery: 11/13/18 . 3. Normal Anatomy Scan, incomplete for RVOT,3VC (possibility of 2VC) d/t fetal position   Recommendations: 1.Clinical correlation with the patient's History and Physical Exam.   Abeer Alsammarraie,RDMS

## 2018-06-22 ENCOUNTER — Telehealth: Payer: Self-pay | Admitting: Obstetrics and Gynecology

## 2018-06-22 NOTE — Telephone Encounter (Signed)
Spoke with pt we discussed her US results, [facility]

## 2018-06-22 NOTE — Telephone Encounter (Signed)
The patient is requesting the FU ultrasound be done today, tomorrow or Monday because she stated she is not willing to wait b/c she is worried about her baby, and she is going out of town and does not want to wait that long to see what is wrong.  The disposition from yesterday, 06/21/18 is below:  Follow-up disposition: Return in about 2 weeks (around 07/05/2018).  Check out comments: 2 wks for follow up ultrasound 4 wks for ROB   The patient was informed that the nurse or provider have to approve changing her US sooner as it may be a reason for it being out 2 weeks, and may not be possible to schedule it sooner than 07/06/18.  The nurse will review her chart and reach out to her with that information, please advise, thanks.

## 2018-06-27 ENCOUNTER — Encounter: Payer: BLUE CROSS/BLUE SHIELD | Admitting: Obstetrics and Gynecology

## 2018-06-27 ENCOUNTER — Other Ambulatory Visit: Payer: BLUE CROSS/BLUE SHIELD

## 2018-07-06 ENCOUNTER — Ambulatory Visit (INDEPENDENT_AMBULATORY_CARE_PROVIDER_SITE_OTHER): Payer: BLUE CROSS/BLUE SHIELD

## 2018-07-06 DIAGNOSIS — Z3482 Encounter for supervision of other normal pregnancy, second trimester: Secondary | ICD-10-CM | POA: Diagnosis not present

## 2018-07-18 ENCOUNTER — Ambulatory Visit (INDEPENDENT_AMBULATORY_CARE_PROVIDER_SITE_OTHER): Payer: BLUE CROSS/BLUE SHIELD | Admitting: Certified Nurse Midwife

## 2018-07-18 VITALS — BP 107/47 | HR 82 | Wt 128.7 lb

## 2018-07-18 DIAGNOSIS — Z3480 Encounter for supervision of other normal pregnancy, unspecified trimester: Secondary | ICD-10-CM | POA: Insufficient documentation

## 2018-07-18 DIAGNOSIS — Z3482 Encounter for supervision of other normal pregnancy, second trimester: Secondary | ICD-10-CM

## 2018-07-18 LAB — POCT URINALYSIS DIPSTICK OB
Bilirubin, UA: NEGATIVE
Glucose, UA: NEGATIVE
Ketones, UA: NEGATIVE
LEUKOCYTES UA: NEGATIVE
NITRITE UA: NEGATIVE
PH UA: 6.5 (ref 5.0–8.0)
PROTEIN: NEGATIVE
RBC UA: NEGATIVE
SPEC GRAV UA: 1.015 (ref 1.010–1.025)
Urobilinogen, UA: 0.2 E.U./dL

## 2018-07-18 NOTE — Patient Instructions (Addendum)
WHAT OB PATIENTS CAN EXPECT   Confirmation of pregnancy and ultrasound ordered if medically indicated-[redacted] weeks gestation  New OB (NOB) intake with nurse and New OB (NOB) labs- [redacted] weeks gestation  New OB (NOB) physical examination with provider- 11/[redacted] weeks gestation  Flu vaccine-[redacted] weeks gestation  Anatomy scan-[redacted] weeks gestation  Glucose tolerance test, blood work to test for anemia, T-dap vaccine-[redacted] weeks gestation  Vaginal swabs/cultures-STD/Group B strep-[redacted] weeks gestation  Appointments every 4 weeks until 28 weeks  Every 2 weeks from 28 weeks until 36 weeks  Weekly visits from 36 weeks until delivery  Back Pain in Pregnancy Back pain during pregnancy is common. Back pain may be caused by several factors that are related to changes during your pregnancy. Follow these instructions at home: Managing pain, stiffness, and swelling  If directed, apply ice for sudden (acute) back pain. ? Put ice in a plastic bag. ? Place a towel between your skin and the bag. ? Leave the ice on for 20 minutes, 2-3 times per day.  If directed, apply heat to the affected area before you exercise: ? Place a towel between your skin and the heat pack or heating pad. ? Leave the heat on for 20-30 minutes. ? Remove the heat if your skin turns bright red. This is especially important if you are unable to feel pain, heat, or cold. You may have a greater risk of getting burned. Activity  Exercise as told by your health care provider. Exercising is the best way to prevent or manage back pain.  Listen to your body when lifting. If lifting hurts, ask for help or bend your knees. This uses your leg muscles instead of your back muscles.  Squat down when picking up something from the floor. Do not bend over.  Only use bed rest as told by your health care provider. Bed rest should only be used for the most severe episodes of back pain. Standing, Sitting, and Lying Down  Do not stand in one place for long  periods of time.  Use good posture when sitting. Make sure your head rests over your shoulders and is not hanging forward. Use a pillow on your lower back if necessary.  Try sleeping on your side, preferably the left side, with a pillow or two between your legs. If you are sore after a night's rest, your bed may be too soft. A firm mattress may provide more support for your back during pregnancy. General instructions  Do not wear high heels.  Eat a healthy diet. Try to gain weight within your health care provider's recommendations.  Use a maternity girdle, elastic sling, or back brace as told by your health care provider.  Take over-the-counter and prescription medicines only as told by your health care provider.  Keep all follow-up visits as told by your health care provider. This is important. This includes any visits with any specialists, such as a physical therapist. Contact a health care provider if:  Your back pain interferes with your daily activities.  You have increasing pain in other parts of your body. Get help right away if:  You develop numbness, tingling, weakness, or problems with the use of your arms or legs.  You develop severe back pain that is not controlled with medicine.  You have a sudden change in bowel or bladder control.  You develop shortness of breath, dizziness, or you faint.  You develop nausea, vomiting, or sweating.  You have back pain that is a rhythmic, cramping  pain similar to labor pains. Labor pain is usually 1-2 minutes apart, lasts for about 1 minute, and involves a bearing down feeling or pressure in your pelvis.  You have back pain and your water breaks or you have vaginal bleeding.  You have back pain or numbness that travels down your leg.  Your back pain developed after you fell.  You develop pain on one side of your back.  You see blood in your urine.  You develop skin blisters in the area of your back pain. This information is  not intended to replace advice given to you by your health care provider. Make sure you discuss any questions you have with your health care provider. Document Released: 10/27/2005 Document Revised: 12/25/2015 Document Reviewed: 04/02/2015 Elsevier Interactive Patient Education  2018 [employer] Round Ligament Pain The round ligament is a cord of muscle and tissue that helps to support the uterus. It can become a source of pain during pregnancy if it becomes stretched or twisted as the baby grows. The pain usually begins in the second trimester of pregnancy, and it can come and go until the baby is delivered. It is not a serious problem, and it does not cause harm to the baby. Round ligament pain is usually a short, sharp, and pinching pain, but it can also be a dull, lingering, and aching pain. The pain is felt in the lower side of the abdomen or in the groin. It usually starts deep in the groin and moves up to the outside of the hip area. Pain can occur with:  A sudden change in position.  Rolling over in bed.  Coughing or sneezing.  Physical activity.  Follow these instructions at home: Watch your condition for any changes. Take these steps to help with your pain:  When the pain starts, relax. Then try: ? Sitting down. ? Flexing your knees up to your abdomen. ? Lying on your side with one pillow under your abdomen and another pillow between your legs. ? Sitting in a warm bath for 15-20 minutes or until the pain goes away.  Take over-the-counter and prescription medicines only as told by your health care provider.  Move slowly when you sit and stand.  Avoid long walks if they cause pain.  Stop or lessen your physical activities if they cause pain.  Contact a health care provider if:  Your pain does not go away with treatment.  You feel pain in your back that you did not have before.  Your medicine is not helping. Get help right away if:  You develop a fever or  chills.  You develop uterine contractions.  You develop vaginal bleeding.  You develop nausea or vomiting.  You develop diarrhea.  You have pain when you urinate. This information is not intended to replace advice given to you by your health care provider. Make sure you discuss any questions you have with your health care provider. Document Released: 04/27/2008 Document Revised: 12/25/2015 Document Reviewed: 09/25/2014 Elsevier Interactive Patient Education  2018 [employer] Second Trimester of Pregnancy The second trimester is from week 13 through week 28, month 4 through 6. This is often the time in pregnancy that you feel your best. Often times, morning sickness has lessened or quit. You may have more energy, and you may get hungry more often. Your unborn baby (fetus) is growing rapidly. At the end of the sixth month, he or she is about 9 inches long and weighs about 1 pounds. You will  likely feel the baby move (quickening) between 18 and 20 weeks of pregnancy. Follow these instructions at home:  Avoid all smoking, herbs, and alcohol. Avoid drugs not approved by your doctor.  Do not use any tobacco products, including cigarettes, chewing tobacco, and electronic cigarettes. If you need help quitting, ask your doctor. You may get counseling or other support to help you quit.  Only take medicine as told by your doctor. Some medicines are safe and some are not during pregnancy.  Exercise only as told by your doctor. Stop exercising if you start having cramps.  Eat regular, healthy meals.  Wear a good support bra if your breasts are tender.  Do not use hot tubs, steam rooms, or saunas.  Wear your seat belt when driving.  Avoid raw meat, uncooked cheese, and liter boxes and soil used by cats.  Take your prenatal vitamins.  Take 1500-2000 milligrams of calcium daily starting at the 20th week of pregnancy until you deliver your baby.  Try taking medicine that helps you poop  (stool softener) as needed, and if your doctor approves. Eat more fiber by eating fresh fruit, vegetables, and whole grains. Drink enough fluids to keep your pee (urine) clear or pale yellow.  Take warm water baths (sitz baths) to soothe pain or discomfort caused by hemorrhoids. Use hemorrhoid cream if your doctor approves.  If you have puffy, bulging veins (varicose veins), wear support hose. Raise (elevate) your feet for 15 minutes, 3-4 times a day. Limit salt in your diet.  Avoid heavy lifting, wear low heals, and sit up straight.  Rest with your legs raised if you have leg cramps or low back pain.  Visit your dentist if you have not gone during your pregnancy. Use a soft toothbrush to brush your teeth. Be gentle when you floss.  You can have sex (intercourse) unless your doctor tells you not to.  Go to your doctor visits. Get help if:  You feel dizzy.  You have mild cramps or pressure in your lower belly (abdomen).  You have a nagging pain in your belly area.  You continue to feel sick to your stomach (nauseous), throw up (vomit), or have watery poop (diarrhea).  You have bad smelling fluid coming from your vagina.  You have pain with peeing (urination). Get help right away if:  You have a fever.  You are leaking fluid from your vagina.  You have spotting or bleeding from your vagina.  You have severe belly cramping or pain.  You lose or gain weight rapidly.  You have trouble catching your breath and have chest pain.  You notice sudden or extreme puffiness (swelling) of your face, hands, ankles, feet, or legs.  You have not felt the baby move in over an hour.  You have severe headaches that do not go away with medicine.  You have vision changes. This information is not intended to replace advice given to you by your health care provider. Make sure you discuss any questions you have with your health care provider. Document Released: 10/13/2009 Document Revised:  12/25/2015 Document Reviewed: 09/19/2012 Elsevier Interactive Patient Education  2017 [employer].

## 2018-07-18 NOTE — Progress Notes (Signed)
ROB, no complaints.  

## 2018-07-20 MED ORDER — BENZOCAINE-MENTHOL 20-0.5 % EX AERO: 1.0000 "application " | INHALATION_SPRAY | Freq: Four times a day (QID) | CUTANEOUS | 2 refills | Status: DC | PRN

## 2018-07-20 NOTE — Progress Notes (Signed)
ROB-Doing well, reports vaginal irritation without discharge. Education regarding home treatment measures. Rx: Dermoplast, see orders. Using abdominal support and reports relief of round ligament pain. Discussed travel precautions as pt has several trips scheduled over the holidays. Anticipatory guidance regarding course of prenatal care. Reviewed red flag symptoms and when to call. RTC x 4-5 weeks for 28 week labs and ROB or sooner if needed.

## 2018-07-28 ENCOUNTER — Telehealth: Payer: Self-pay

## 2018-07-28 NOTE — Telephone Encounter (Signed)
Pt request dental clearance. She has a broken tooth.  Clearance faxed to [phone]. Pt aware.

## 2018-07-28 NOTE — Telephone Encounter (Signed)
Pt request another clearance that states she can have a xray on her tooth.   Per AT ok to have xray. Updated letter faxed with confirmation.   Lm informing pt.

## 2018-08-02 NOTE — L&D Delivery Note (Signed)
Delivery Note  1318 In room to see patient, reports pelvic pressure and the urge to push. SVE: 10/100/+1, vertex.   Effective maternal pushing efforts noted. Spontaneous vaginal birth of liveborn female infant in left occiput anterior position with left compound hand at 1326. Loose nuchal cord easily reduced on perineum, infant to maternal abdomen, skin to skin, three (3) vessel cord. APGARs: 8, 9. Weight pending. Receiving nurse present at bedside for birth.   Spontaneous delivery of intact placenta at 1329. IM Pitocin given. Perineum intact. Vault check completed. Fundus firm. Small lochia. Anesthesia: none. QBL: 430.   Initiate routine postpartum care and orders. Mom to postpartum.  Baby to Couplet care / Skin to Skin.   Gunnar Bulla, CNM [facility], [facility] 11/05/2018, 1:45 PM

## 2018-08-15 ENCOUNTER — Ambulatory Visit (INDEPENDENT_AMBULATORY_CARE_PROVIDER_SITE_OTHER): Payer: BLUE CROSS/BLUE SHIELD | Admitting: Certified Nurse Midwife

## 2018-08-15 ENCOUNTER — Other Ambulatory Visit: Payer: BLUE CROSS/BLUE SHIELD

## 2018-08-15 ENCOUNTER — Other Ambulatory Visit (HOSPITAL_COMMUNITY)
Admission: RE | Admit: 2018-08-15 | Discharge: 2018-08-15 | Disposition: A | Discharge status: Home / Self Care | Payer: BLUE CROSS/BLUE SHIELD | Source: Ambulatory Visit | Attending: Certified Nurse Midwife | Admitting: Certified Nurse Midwife

## 2018-08-15 VITALS — BP 103/61 | HR 85 | Wt 131.7 lb

## 2018-08-15 DIAGNOSIS — O26893 Other specified pregnancy related conditions, third trimester: Secondary | ICD-10-CM | POA: Insufficient documentation

## 2018-08-15 DIAGNOSIS — Z3482 Encounter for supervision of other normal pregnancy, second trimester: Secondary | ICD-10-CM | POA: Diagnosis not present

## 2018-08-15 DIAGNOSIS — M545 Low back pain, unspecified: Secondary | ICD-10-CM

## 2018-08-15 DIAGNOSIS — O26899 Other specified pregnancy related conditions, unspecified trimester: Secondary | ICD-10-CM | POA: Insufficient documentation

## 2018-08-15 DIAGNOSIS — N898 Other specified noninflammatory disorders of vagina: Secondary | ICD-10-CM

## 2018-08-15 DIAGNOSIS — R102 Pelvic and perineal pain: Secondary | ICD-10-CM | POA: Insufficient documentation

## 2018-08-15 DIAGNOSIS — Z130 Encounter for screening for diseases of the blood and blood-forming organs and certain disorders involving the immune mechanism: Secondary | ICD-10-CM

## 2018-08-15 DIAGNOSIS — Z131 Encounter for screening for diabetes mellitus: Secondary | ICD-10-CM

## 2018-08-15 DIAGNOSIS — Z113 Encounter for screening for infections with a predominantly sexual mode of transmission: Secondary | ICD-10-CM

## 2018-08-15 LAB — POCT URINALYSIS DIPSTICK OB
Bilirubin, UA: NEGATIVE
Blood, UA: NEGATIVE
Ketones, UA: NEGATIVE
LEUKOCYTES UA: NEGATIVE
NITRITE UA: NEGATIVE
PROTEIN: NEGATIVE
Spec Grav, UA: 1.02 (ref 1.010–1.025)
Urobilinogen, UA: 0.2 E.U./dL
pH, UA: 6.5 (ref 5.0–8.0)

## 2018-08-15 MED ORDER — METRONIDAZOLE 500 MG PO TABS: 500.0000 mg | ORAL_TABLET | Freq: Two times a day (BID) | ORAL | 0 refills | Status: AC

## 2018-08-15 MED ORDER — FLUCONAZOLE 150 MG PO TABS: 150.0000 mg | ORAL_TABLET | ORAL | 3 refills | Status: DC

## 2018-08-15 MED ORDER — TETANUS-DIPHTH-ACELL PERTUSSIS 5-2.5-18.5 LF-MCG/0.5 IM SUSP: 0.5000 mL | Freq: Once | INTRAMUSCULAR | Status: DC

## 2018-08-15 NOTE — Progress Notes (Signed)
ROB-Reports pelvic pressure and back pain despite use of abdominal support and increased vaginal discharge. Vaginal swab collected.  Discussed home treatment measures. Referral to Physical Therapy, see orders. Rx: Diflucan and Flagyl, see orders. 28 week labs today. Blood transfusion consent reviewed and signed. Anticipatory guidance regarding course of prenatal care. Reviewed red flag symptoms and when to call. RTC x 2 weeks for ROB or sooner if needed.

## 2018-08-15 NOTE — Progress Notes (Signed)
ROB- Patient c/o intermittent pelvic and lower back pressure that started 1 month ago, using abdominal belt support with no relief.  Patient also c/o thick white vaginal d/c and itching "for weeks".

## 2018-08-15 NOTE — Patient Instructions (Addendum)
WE WOULD LOVE TO HEAR FROM YOU!!!!   Thank you Vance Gather for visiting [facility].  Providing our patients with the best experience possible is really important to [place], and we hope that you felt that on your recent visit. The most valuable feedback we get comes from YOU!!    If you receive a survey please take a couple of minutes to let us know how we did.Thank you for continuing to trust [place] with your care.   [facility]      WHAT OB PATIENTS CAN EXPECT   Confirmation of pregnancy and ultrasound ordered if medically indicated-[redacted] weeks gestation  New OB (NOB) intake with nurse and New OB (NOB) labs- [redacted] weeks gestation  New OB (NOB) physical examination with provider- 11/[redacted] weeks gestation  Flu vaccine-[redacted] weeks gestation  Anatomy scan-[redacted] weeks gestation  Glucose tolerance test, blood work to test for anemia, T-dap vaccine-[redacted] weeks gestation  Vaginal swabs/cultures-STD/Group B strep-[redacted] weeks gestation  Appointments every 4 weeks until 28 weeks  Every 2 weeks from 28 weeks until 36 weeks  Weekly visits from 36 weeks until delivery  Back Pain in Pregnancy Back pain during pregnancy is common. Back pain may be caused by several factors that are related to changes during your pregnancy. Follow these instructions at home: Managing pain, stiffness, and swelling      If directed, for sudden (acute) back pain, put ice on the painful area. ? Put ice in a plastic bag. ? Place a towel between your skin and the bag. ? Leave the ice on for 20 minutes, 2-3 times per day.  If directed, apply heat to the affected area before you exercise. Use the heat source that your health care provider recommends, such as a moist heat pack or a heating pad. ? Place a towel between your skin and the heat source. ? Leave the heat on for 20-30 minutes. ? Remove the heat if your skin turns bright red. This is especially important if you are unable to feel pain, heat, or cold.  You may have a greater risk of getting burned.  If directed, massage the affected area. Activity  Exercise as told by your health care provider. Gentle exercise is the best way to prevent or manage back pain.  Listen to your body when lifting. If lifting hurts, ask for help or bend your knees. This uses your leg muscles instead of your back muscles.  Squat down when picking up something from the floor. Do not bend over.  Only use bed rest for short periods as told by your health care provider. Bed rest should only be used for the most severe episodes of back pain. Standing, sitting, and lying down  Do not stand in one place for long periods of time.  Use good posture when sitting. Make sure your head rests over your shoulders and is not hanging forward. Use a pillow on your lower back if necessary.  Try sleeping on your side, preferably the left side, with a pregnancy support pillow or 1-2 regular pillows between your legs. ? If you have back pain after a night's rest, your bed may be too soft. ? A firm mattress may provide more support for your back during pregnancy. General instructions  Do not wear high heels.  Eat a healthy diet. Try to gain weight within your health care provider's recommendations.  Use a maternity girdle, elastic sling, or back brace as told by your health care provider.  Take over-the-counter  and prescription medicines only as told by your health care provider.  Work with a physical therapist or massage therapist to find ways to manage back pain. Acupuncture or massage therapy may be helpful.  Keep all follow-up visits as told by your health care provider. This is important. Contact a health care provider if:  Your back pain interferes with your daily activities.  You have increasing pain in other parts of your body. Get help right away if:  You develop numbness, tingling, weakness, or problems with the use of your arms or legs.  You develop severe  back pain that is not controlled with medicine.  You have a change in bowel or bladder control.  You develop shortness of breath, dizziness, or you faint.  You develop nausea, vomiting, or sweating.  You have back pain that is a rhythmic, cramping pain similar to labor pains. Labor pain is usually 1-2 minutes apart, lasts for about 1 minute, and involves a bearing down feeling or pressure in your pelvis.  You have back pain and your water breaks or you have vaginal bleeding.  You have back pain or numbness that travels down your leg.  Your back pain developed after you fell.  You develop pain on one side of your back.  You see blood in your urine.  You develop skin blisters in the area of your back pain. Summary  Back pain may be caused by several factors that are related to changes during your pregnancy.  Follow instructions as told by your health care provider for managing pain, stiffness, and swelling.  Exercise as told by your health care provider. Gentle exercise is the best way to prevent or manage back pain.  Take over-the-counter and prescription medicines only as told by your health care provider.  Keep all follow-up visits as told by your health care provider. This is important. This information is not intended to replace advice given to you by your health care provider. Make sure you discuss any questions you have with your health care provider. Document Released: 10/27/2005 Document Revised: 01/04/2018 Document Reviewed: 01/04/2018 Elsevier Interactive Patient Education  2019 [employer] Third Trimester of Pregnancy  The third trimester is from week 28 through week 40 (months 7 through 9). This trimester is when your unborn baby (fetus) is growing very fast. At the end of the ninth month, the unborn baby is about 20 inches in length. It weighs about 6-10 pounds. Follow these instructions at home: Medicines  Take over-the-counter and prescription medicines only  as told by your doctor. Some medicines are safe and some medicines are not safe during pregnancy.  Take a prenatal vitamin that contains at least 600 micrograms (mcg) of folic acid.  If you have trouble pooping (constipation), take medicine that will make your stool soft (stool softener) if your doctor approves. Eating and drinking   Eat regular, healthy meals.  Avoid raw meat and uncooked cheese.  If you get low calcium from the food you eat, talk to your doctor about taking a daily calcium supplement.  Eat four or five small meals rather than three large meals a day.  Avoid foods that are high in fat and sugars, such as fried and sweet foods.  To prevent constipation: ? Eat foods that are high in fiber, like fresh fruits and vegetables, whole grains, and beans. ? Drink enough fluids to keep your pee (urine) clear or pale yellow. Activity  Exercise only as told by your doctor. Stop exercising if you  start to have cramps.  Avoid heavy lifting, wear low heels, and sit up straight.  Do not exercise if it is too hot, too humid, or if you are in a place of great height (high altitude).  You may continue to have sex unless your doctor tells you not to. Relieving pain and discomfort  Wear a good support bra if your breasts are tender.  Take frequent breaks and rest with your legs raised if you have leg cramps or low back pain.  Take warm water baths (sitz baths) to soothe pain or discomfort caused by hemorrhoids. Use hemorrhoid cream if your doctor approves.  If you develop puffy, bulging veins (varicose veins) in your legs: ? Wear support hose or compression stockings as told by your doctor. ? Raise (elevate) your feet for 15 minutes, 3-4 times a day. ? Limit salt in your food. Safety  Wear your seat belt when driving.  Make a list of emergency phone numbers, including numbers for family, friends, the hospital, and police and fire departments. Preparing for your baby's  arrival To prepare for the arrival of your baby:  Take prenatal classes.  Practice driving to the hospital.  Visit the hospital and tour the maternity area.  Talk to your work about taking leave once the baby comes.  Pack your hospital bag.  Prepare the baby's room.  Go to your doctor visits.  Buy a rear-facing car seat. Learn how to install it in your car. General instructions  Do not use hot tubs, steam rooms, or saunas.  Do not use any products that contain nicotine or tobacco, such as cigarettes and e-cigarettes. If you need help quitting, ask your doctor.  Do not drink alcohol.  Do not douche or use tampons or scented sanitary pads.  Do not cross your legs for long periods of time.  Do not travel for long distances unless you must. Only do so if your doctor says it is okay.  Visit your dentist if you have not gone during your pregnancy. Use a soft toothbrush to brush your teeth. Be gentle when you floss.  Avoid cat litter boxes and soil used by cats. These carry germs that can cause birth defects in the baby and can cause a loss of your baby (miscarriage) or stillbirth.  Keep all your prenatal visits as told by your doctor. This is important. Contact a doctor if:  You are not sure if you are in labor or if your water has broken.  You are dizzy.  You have mild cramps or pressure in your lower belly.  You have a nagging pain in your belly area.  You continue to feel sick to your stomach, you throw up, or you have watery poop.  You have bad smelling fluid coming from your vagina.  You have pain when you pee. Get help right away if:  You have a fever.  You are leaking fluid from your vagina.  You are spotting or bleeding from your vagina.  You have severe belly cramps or pain.  You lose or gain weight quickly.  You have trouble catching your breath and have chest pain.  You notice sudden or extreme puffiness (swelling) of your face, hands, ankles,  feet, or legs.  You have not felt the baby move in over an hour.  You have severe headaches that do not go away with medicine.  You have trouble seeing.  You are leaking, or you are having a gush of fluid, from your vagina  before you are 37 weeks.  You have regular belly spasms (contractions) before you are 37 weeks. Summary  The third trimester is from week 28 through week 40 (months 7 through 9). This time is when your unborn baby is growing very fast.  Follow your doctor's advice about medicine, food, and activity.  Get ready for the arrival of your baby by taking prenatal classes, getting all the baby items ready, preparing the baby's room, and visiting your doctor to be checked.  Get help right away if you are bleeding from your vagina, or you have chest pain and trouble catching your breath, or if you have not felt your baby move in over an hour. This information is not intended to replace advice given to you by your health care provider. Make sure you discuss any questions you have with your health care provider. Document Released: 10/13/2009 Document Revised: 08/24/2016 Document Reviewed: 08/24/2016 Elsevier Interactive Patient Education  2019 [employer].

## 2018-08-16 LAB — CBC
HEMOGLOBIN: 10.8 g/dL — AB (ref 11.1–15.9)
Hematocrit: 32.5 % — ABNORMAL LOW (ref 34.0–46.6)
MCH: 31 pg (ref 26.6–33.0)
MCHC: 33.2 g/dL (ref 31.5–35.7)
MCV: 93 fL (ref 79–97)
Platelets: 222 10*3/uL (ref 150–450)
RBC: 3.48 x10E6/uL — ABNORMAL LOW (ref 3.77–5.28)
RDW: 12.6 % (ref 11.7–15.4)
WBC: 8.4 10*3/uL (ref 3.4–10.8)

## 2018-08-16 LAB — CERVICOVAGINAL ANCILLARY ONLY
Bacterial vaginitis: POSITIVE — AB
Candida vaginitis: POSITIVE — AB

## 2018-08-16 LAB — RPR: RPR Ser Ql: NONREACTIVE

## 2018-08-16 LAB — GLUCOSE, 1 HOUR GESTATIONAL: GESTATIONAL DIABETES SCREEN: 61 mg/dL — AB (ref 65–139)

## 2018-08-17 ENCOUNTER — Encounter: Payer: Self-pay | Admitting: Certified Nurse Midwife

## 2018-08-17 DIAGNOSIS — O99019 Anemia complicating pregnancy, unspecified trimester: Secondary | ICD-10-CM | POA: Insufficient documentation

## 2018-08-17 NOTE — Progress Notes (Signed)
Please contact patient, vaginal swab positive for yeast and bv-take medication as prescribed. Increase iron supplementation or change prenatal vitamin. Encourage to activate MyChart. Thanks, JML.

## 2018-08-25 ENCOUNTER — Encounter: Payer: Self-pay | Admitting: Physical Therapy

## 2018-08-25 ENCOUNTER — Other Ambulatory Visit: Payer: Self-pay

## 2018-08-25 ENCOUNTER — Ambulatory Visit: Payer: BLUE CROSS/BLUE SHIELD | Attending: Certified Nurse Midwife | Admitting: Physical Therapy

## 2018-08-25 DIAGNOSIS — M62838 Other muscle spasm: Secondary | ICD-10-CM | POA: Insufficient documentation

## 2018-08-25 DIAGNOSIS — M533 Sacrococcygeal disorders, not elsewhere classified: Secondary | ICD-10-CM | POA: Insufficient documentation

## 2018-08-25 DIAGNOSIS — R2689 Other abnormalities of gait and mobility: Secondary | ICD-10-CM

## 2018-08-25 NOTE — Therapy (Addendum)
[place] [facility] MAIN [facility] SERVICES [address] [place], [place], [address] Phone: [phone]   Fax:  [phone]  Physical Therapy Evaluation  Patient Details  Name: Amber Herrera MRN: [id number] Date of Birth: 01/14/1991 Referring Provider (PT): Serafina Royals    Encounter Date: 08/25/2018  PT End of Session - 08/25/18 1926    Visit Number  1    Number of Visits  6    Date for PT Re-Evaluation  11/03/18    PT Start Time  0910    PT Stop Time  1005    PT Time Calculation (min)  55 min    Activity Tolerance  Patient tolerated treatment well;No increased pain    Behavior During Therapy  [facility] for tasks assessed/performed       History reviewed. No pertinent past medical history.  History reviewed. No pertinent surgical history.  There were no vitals filed for this visit.   Subjective Assessment - 08/25/18 0912    Subjective  Pt has LBP occurs with walking, sleeping, standing for > 5 min. This pain started this month during your 2nd trimester of her 4th pregnancy.  Pt has had LBP during previous 3 pregnancies during their 3rd trimester. LBP resolved after delivery. Pain is located midback to middle of posterior pelvis.  Urinage leakage occurs with sneezing.  Bowel movements occur every other day with straining alot .OB/GYN:  L& D without complications without episiotomies nor tearing.  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 Pt is not able to clean and cook due to the LBP.        Patient Stated Goals  decrease pain and be able to sleep, stand, cook          [facility] PT Assessment - 08/25/18 0922      Assessment   Medical Diagnosis  LBP 2nd trimester     Referring Provider (PT)  Serafina Royals       Precautions   Precautions  None      Restrictions   Weight Bearing Restrictions  No      Balance Screen   Has the patient fallen in the past 6 months  No      Strength   Overall Strength Comments  BLE hip flexion 3+/5, kneeflex/ext 4/5 B         Palpation   SI assessment   flinching pain at L distal attachment of QL, R medial aspect of scapula, tightness at paraspinals R >L ( post Tx improved, no flinching pain with light palpation at areas noted , paraspinals pliability restored)     Palpation comment  supine: R upslip with R malleoli more superior > L ( post Tx: leg length equal, ASIS levelled)                  Objective measurements completed on examination: See above findings.      [facility] Adult PT Treatment/Exercise - 08/25/18 1933      Neuro Re-ed    Neuro Re-ed Details   education on breathing for relaxaiton , minimizing mm spasms, technique for HEP       Modalities   Modalities  Moist Heat      Moist Heat Therapy   Number Minutes Moist Heat  5 Minutes    Moist Heat Location  --   thoracic     Manual Therapy   Manual therapy comments  L LE distraction, lumbar distraction in sidelying,  MWM/STM along R thoracic  PT Long Term Goals - 08/31/18 1256      PT LONG TERM GOAL #1   Title  Pt will decrease her PDI sore from 41% to < 21% in order to return to ADLs    Time  6    Period  Weeks    Status  New      PT LONG TERM GOAL #2   Title  Pt will demo equal alignment of pelvic girdle across 2 visits in order to to load pelvic girdle for standing and walking     Time  2    Period  Weeks    Status  New      PT LONG TERM GOAL #3   Title  Pt will demo decreased paraspinal mm tightness on R and no tendenress at L iliac crest/ QL in order to progress to deep core/ throacolumbar strengthening HEP    Time  2    Period  Weeks    Status  New      PT LONG TERM GOAL #4   Title  Pt will proper body mechanics with childcaring, proper lifting/ bending mechanics to minimzie SIJ dysfunction     Time  4    Period  Weeks    Status  New               Plan - 08/25/18 1927    Clinical Impression Statement  Pt is a 28 yo female who complaints of mid/low back pain without radiculopathy.  Pt is in her 2nd trimester of her 4 th child and has had a Hx of LBP during her prior pregnancies. Pt's clinical presentations include increased spinal mm tightness, limited thoracic rotation, pelvic malialignment ( L lower than R), and dyscoordination of deep core mm. Pt required manual Tx to bring pelvic girdle into proper alignment, decrease spinal mm spasms, and to educate the use of relaxation and breathing techniques to minimize mm guarding and manage pain. Pt reported her pain decreased post Tx and she demonstrated no flinching pain at prior areas of tightness. Plan to progress with thoracolumbar/ lower kinetic chain strengthening at next session.    Clinical Presentation  Evolving    Clinical Decision Making  Moderate    Rehab Potential  Good    PT Frequency  1x / week   6   PT Treatment/Interventions  Functional mobility training;Scar mobilization;Manual techniques;Patient/family education;Therapeutic exercise;Therapeutic activities;Moist Heat;Gait training;Neuromuscular re-education;Taping    Consulted and Agree with Plan of Care  Patient   child      Patient will benefit from skilled therapeutic intervention in order to improve the following deficits and impairments:  Difficulty walking, Increased muscle spasms, Hypomobility, Decreased range of motion, Decreased coordination, Postural dysfunction, Pain, Improper body mechanics, Decreased balance  Visit Diagnosis: Sacrococcygeal disorders, not elsewhere classified  Other muscle spasm  Other abnormalities of gait and mobility     Problem List Patient Active Problem List   Diagnosis Date Noted  . Anemia during pregnancy 08/17/2018  . Pelvic pain during pregnancy 08/15/2018  . Low back pain during pregnancy in third trimester 08/15/2018  . Supervision of other normal pregnancy, antepartum 07/18/2018    Mariane MastersYeung,Shin Yiing ,PT, DPT, E-RYT  08/25/2018, 7:35 PM  [facility] [facility]  [facility]ICES [address] [place], [place], [address] Phone: [phone]   Fax:  [phone]  Name: Vance GatherMarwa Choinski MRN: [id number] Date of Birth: 1991/06/30

## 2018-08-25 NOTE — Patient Instructions (Signed)
  Proper body mechanics with getting out of a chair to decrease strain  on back &pelvic floor   Avoid holding your breath when Getting out of the chair:  Scoot to front part of chair chair Heels behind feet, feet are hip width apart, nose over toes  Inhale like you are smelling roses Exhale to stand     _  Open book ( handout)   ___  Scoot hips increments in bed ( dig feet and elbows down to lift)   ___  Breathing to relax muscles, focus on the support beneath you to relax muscles   ____   Avoid straining pelvic floor, abdominal muscles , spine  Use log rolling technique instead of getting out of bed with your neck or the sit-up     Log rolling into and out of bed If getting out of bed on L side, 1) Bent knees, scoot hips/ shoulder to R   Raise L arm completely overhead, rolling onto armpit  Then lower bent knees to bed to get into complete side lying position  Then drop legs off bed, and push up onto L elbow/forearm, and use R hand to push onto the bend

## 2018-08-30 ENCOUNTER — Ambulatory Visit (INDEPENDENT_AMBULATORY_CARE_PROVIDER_SITE_OTHER): Payer: BLUE CROSS/BLUE SHIELD | Admitting: Certified Nurse Midwife

## 2018-08-30 ENCOUNTER — Encounter: Payer: Self-pay | Admitting: Certified Nurse Midwife

## 2018-08-30 ENCOUNTER — Ambulatory Visit (INDEPENDENT_AMBULATORY_CARE_PROVIDER_SITE_OTHER): Payer: BLUE CROSS/BLUE SHIELD

## 2018-08-30 VITALS — BP 96/57 | HR 93 | Wt 131.5 lb

## 2018-08-30 DIAGNOSIS — O26893 Other specified pregnancy related conditions, third trimester: Secondary | ICD-10-CM | POA: Diagnosis not present

## 2018-08-30 DIAGNOSIS — Z3482 Encounter for supervision of other normal pregnancy, second trimester: Secondary | ICD-10-CM

## 2018-08-30 DIAGNOSIS — Z362 Encounter for other antenatal screening follow-up: Secondary | ICD-10-CM

## 2018-08-30 DIAGNOSIS — R102 Pelvic and perineal pain: Secondary | ICD-10-CM

## 2018-08-30 DIAGNOSIS — M545 Low back pain, unspecified: Secondary | ICD-10-CM

## 2018-08-30 DIAGNOSIS — Z3A29 29 weeks gestation of pregnancy: Secondary | ICD-10-CM | POA: Diagnosis not present

## 2018-08-30 DIAGNOSIS — O26899 Other specified pregnancy related conditions, unspecified trimester: Secondary | ICD-10-CM

## 2018-08-30 NOTE — Progress Notes (Signed)
ROB doing well. No complaints feels good movement. Growth u/s today WNL see below. Follow up 2 wks  Doreene Burke, CNM   Patient Name: Amber Herrera DOB: May 15, 1991 MRN: [id number]  ULTRASOUND REPORT  Location: [facility] Date of Service: 08/30/2018   Indications:growth/afi Findings:  Mason Jim intrauterine pregnancy is visualized with FHR at 144 BPM. Biometrics give an (U/S) Gestational age of [redacted]w[redacted]d and an (U/S) EDD of 11/06/18; this correlates with the clinically established Estimated Date of Delivery: 11/13/18.  Fetal presentation is Cephalic.  Placenta: posterior. Grade: 1 AFI: 12.2 cm  Growth percentile is 57. EFW: 3lb5oz/1510g (document both grams and pounds)  Impression: 1. [redacted]w[redacted]d Viable Singleton Intrauterine pregnancy previously established criteria. 2. Growth is 57 %ile.  AFI is 12.2 cm.   Recommendations: 1.Clinical correlation with the patient's History and Physical Exam. 2.   Augustine Radar, RDMS

## 2018-08-30 NOTE — Patient Instructions (Signed)
[facility]  [facility]  [address], [place], [place] [address]  Phone: [phone]  [facility] (second location)  [address] [place], [place] [address]  Phone: [phone]  [facility] ([place]) [address], [place], [place] [address] Phone: [phone]  [facility]  [address] [place], [place] [address]  Phone: [phone] 

## 2018-08-31 ENCOUNTER — Ambulatory Visit: Payer: BLUE CROSS/BLUE SHIELD | Admitting: Physical Therapy

## 2018-08-31 ENCOUNTER — Encounter: Payer: Self-pay | Admitting: Physical Therapy

## 2018-08-31 DIAGNOSIS — M533 Sacrococcygeal disorders, not elsewhere classified: Secondary | ICD-10-CM

## 2018-08-31 DIAGNOSIS — M62838 Other muscle spasm: Secondary | ICD-10-CM

## 2018-08-31 DIAGNOSIS — R2689 Other abnormalities of gait and mobility: Secondary | ICD-10-CM

## 2018-08-31 NOTE — Therapy (Signed)
[place] [facility] MAIN [facility] [address] [place], [place], [address] Phone: [phone]   Fax:  [phone]  Patient Details  Name: Amber GatherMarwa Herrera MRN: [id number] Date of Birth: 1991/01/30 Referring Provider:  Serafina RoyalsMichelle Lawhorn, CNM  Encounter Date: 08/31/2018  Discharge Summary    Pt has achieved 3/4 goals. Pt demo'd more aligned pelvic girdle and decreased paraspinal mm which helped to decrease pt's pain. Pt was phoned after the 1st visit and she stated is better and no longer need more appts. Pt was educated about starting post-partum  pelvic PT after the delivery of her baby with clearance from her MD. Pt was educated on the importance of not straining abdominal mm when in labor to minimize straining pelvic floor and abdominal mm. Pt voiced understanding. Pt is ready for d/c at this time.   PT Long Term Goals - 08/31/18 1256      PT LONG TERM GOAL #1   Title  Pt will decrease her PDI sore from 41% to < 21% in order to return to ADLs    Time  6    Period  Weeks    Status  Unable to reassess     PT LONG TERM GOAL #2   Title  Pt will demo equal alignment of pelvic girdle across 2 visits in order to to load pelvic girdle for standing and walking     Time  2    Period  Weeks    Status Achieved      PT LONG TERM GOAL #3   Title  Pt will demo decreased paraspinal mm tightness on R and no tendenress at L iliac crest/ QL in order to progress to deep core/ throacolumbar strengthening HEP    Time  2    Period  Weeks    Status  achieved     PT LONG TERM GOAL #4   Title  Pt will proper body mechanics with childcaring, proper lifting/ bending mechanics to minimzie SIJ dysfunction     Time  4    Period  Weeks    Status Achieved         Mariane MastersYeung,Shin Yiing ,PT, DPT, E-RYT  08/31/2018, 2:30 PM  [place] [facility] MAIN [facility] [address] , [place], [address] Phone: [phone]   Fax:   [phone]

## 2018-09-06 ENCOUNTER — Encounter: Payer: BLUE CROSS/BLUE SHIELD | Admitting: Physical Therapy

## 2018-09-15 ENCOUNTER — Encounter: Payer: Self-pay | Admitting: Certified Nurse Midwife

## 2018-09-15 ENCOUNTER — Ambulatory Visit (INDEPENDENT_AMBULATORY_CARE_PROVIDER_SITE_OTHER): Payer: BLUE CROSS/BLUE SHIELD | Admitting: Certified Nurse Midwife

## 2018-09-15 VITALS — BP 101/51 | HR 78 | Wt 135.0 lb

## 2018-09-15 DIAGNOSIS — Z3483 Encounter for supervision of other normal pregnancy, third trimester: Secondary | ICD-10-CM

## 2018-09-15 LAB — POCT URINALYSIS DIPSTICK OB
Bilirubin, UA: NEGATIVE
KETONES UA: NEGATIVE
LEUKOCYTES UA: NEGATIVE
NITRITE UA: NEGATIVE
RBC UA: NEGATIVE
SPEC GRAV UA: 1.02 (ref 1.010–1.025)
UROBILINOGEN UA: 0.2 U/dL
pH, UA: 6.5 (ref 5.0–8.0)

## 2018-09-15 NOTE — Progress Notes (Signed)
ROB-No complaints.  

## 2018-09-15 NOTE — Patient Instructions (Signed)
WE WOULD LOVE TO HEAR FROM YOU!!!!   Thank you Vance Gather for visiting [facility].  Providing our patients with the best experience possible is really important to [place], and we hope that you felt that on your recent visit. The most valuable feedback we get comes from YOU!!    If you receive a survey please take a couple of minutes to let us know how we did.Thank you for continuing to trust [place] with your care.   [facility]   WHAT OB PATIENTS CAN EXPECT   Confirmation of pregnancy and ultrasound ordered if medically indicated-[redacted] weeks gestation  New OB (NOB) intake with nurse and New OB (NOB) labs- [redacted] weeks gestation  New OB (NOB) physical examination with provider- 11/[redacted] weeks gestation  Flu vaccine-[redacted] weeks gestation  Anatomy scan-[redacted] weeks gestation  Glucose tolerance test, blood work to test for anemia, T-dap vaccine-[redacted] weeks gestation  Vaginal swabs/cultures-STD/Group B strep-[redacted] weeks gestation  Appointments every 4 weeks until 28 weeks  Every 2 weeks from 28 weeks until 36 weeks  Weekly visits from 36 weeks until delivery  Third Trimester of Pregnancy  The third trimester is from week 28 through week 40 (months 7 through 9). This trimester is when your unborn baby (fetus) is growing very fast. At the end of the ninth month, the unborn baby is about 20 inches in length. It weighs about 6-10 pounds. Follow these instructions at home: Medicines  Take over-the-counter and prescription medicines only as told by your doctor. Some medicines are safe and some medicines are not safe during pregnancy.  Take a prenatal vitamin that contains at least 600 micrograms (mcg) of folic acid.  If you have trouble pooping (constipation), take medicine that will make your stool soft (stool softener) if your doctor approves. Eating and drinking   Eat regular, healthy meals.  Avoid raw meat and uncooked cheese.  If you get low calcium from the  food you eat, talk to your doctor about taking a daily calcium supplement.  Eat four or five small meals rather than three large meals a day.  Avoid foods that are high in fat and sugars, such as fried and sweet foods.  To prevent constipation: ? Eat foods that are high in fiber, like fresh fruits and vegetables, whole grains, and beans. ? Drink enough fluids to keep your pee (urine) clear or pale yellow. Activity  Exercise only as told by your doctor. Stop exercising if you start to have cramps.  Avoid heavy lifting, wear low heels, and sit up straight.  Do not exercise if it is too hot, too humid, or if you are in a place of great height (high altitude).  You may continue to have sex unless your doctor tells you not to. Relieving pain and discomfort  Wear a good support bra if your breasts are tender.  Take frequent breaks and rest with your legs raised if you have leg cramps or low back pain.  Take warm water baths (sitz baths) to soothe pain or discomfort caused by hemorrhoids. Use hemorrhoid cream if your doctor approves.  If you develop puffy, bulging veins (varicose veins) in your legs: ? Wear support hose or compression stockings as told by your doctor. ? Raise (elevate) your feet for 15 minutes, 3-4 times a day. ? Limit salt in your food. Safety  Wear your seat belt when driving.  Make a list of emergency phone numbers, including numbers for family, friends, the hospital, and police and  Garment/textile technologist. Preparing for your baby's arrival To prepare for the arrival of your baby:  Take prenatal classes.  Practice driving to the hospital.  Visit the hospital and tour the maternity area.  Talk to your work about taking leave once the baby comes.  Pack your hospital bag.  Prepare the baby's room.  Go to your doctor visits.  Buy a rear-facing car seat. Learn how to install it in your car. General instructions  Do not use hot tubs, steam rooms, or  saunas.  Do not use any products that contain nicotine or tobacco, such as cigarettes and e-cigarettes. If you need help quitting, ask your doctor.  Do not drink alcohol.  Do not douche or use tampons or scented sanitary pads.  Do not cross your legs for long periods of time.  Do not travel for long distances unless you must. Only do so if your doctor says it is okay.  Visit your dentist if you have not gone during your pregnancy. Use a soft toothbrush to brush your teeth. Be gentle when you floss.  Avoid cat litter boxes and soil used by cats. These carry germs that can cause birth defects in the baby and can cause a loss of your baby (miscarriage) or stillbirth.  Keep all your prenatal visits as told by your doctor. This is important. Contact a doctor if:  You are not sure if you are in labor or if your water has broken.  You are dizzy.  You have mild cramps or pressure in your lower belly.  You have a nagging pain in your belly area.  You continue to feel sick to your stomach, you throw up, or you have watery poop.  You have bad smelling fluid coming from your vagina.  You have pain when you pee. Get help right away if:  You have a fever.  You are leaking fluid from your vagina.  You are spotting or bleeding from your vagina.  You have severe belly cramps or pain.  You lose or gain weight quickly.  You have trouble catching your breath and have chest pain.  You notice sudden or extreme puffiness (swelling) of your face, hands, ankles, feet, or legs.  You have not felt the baby move in over an hour.  You have severe headaches that do not go away with medicine.  You have trouble seeing.  You are leaking, or you are having a gush of fluid, from your vagina before you are 37 weeks.  You have regular belly spasms (contractions) before you are 37 weeks. Summary  The third trimester is from week 28 through week 40 (months 7 through 9). This time is when your  unborn baby is growing very fast.  Follow your doctor's advice about medicine, food, and activity.  Get ready for the arrival of your baby by taking prenatal classes, getting all the baby items ready, preparing the baby's room, and visiting your doctor to be checked.  Get help right away if you are bleeding from your vagina, or you have chest pain and trouble catching your breath, or if you have not felt your baby move in over an hour. This information is not intended to replace advice given to you by your health care provider. Make sure you discuss any questions you have with your health care provider. Document Released: 10/13/2009 Document Revised: 08/24/2016 Document Reviewed: 08/24/2016 Elsevier Interactive Patient Education  2019 [employer].

## 2018-09-15 NOTE — Progress Notes (Signed)
ROB-Reports intermittent right lower rib pain and increased reflux. Discussed home treatment measures. Encouraged daily Protonix use. Completed PT and seeing massage therapist weekly. Moving to [place] in June for husband's new job. Dental clearance letter given. Anticipatory guidance regarding course of prenatal care. Reviewed red flag symptoms and when to call. RTC x 2 weeks for ROB or sooner if needed.

## 2018-09-28 ENCOUNTER — Encounter: Payer: BLUE CROSS/BLUE SHIELD | Admitting: Physical Therapy

## 2018-09-29 ENCOUNTER — Ambulatory Visit: Payer: BLUE CROSS/BLUE SHIELD | Admitting: Obstetrics and Gynecology

## 2018-09-29 VITALS — BP 92/51 | HR 95 | Wt 140.7 lb

## 2018-09-29 DIAGNOSIS — Z3493 Encounter for supervision of normal pregnancy, unspecified, third trimester: Secondary | ICD-10-CM

## 2018-09-29 LAB — POCT URINALYSIS DIPSTICK OB
Bilirubin, UA: NEGATIVE
Blood, UA: NEGATIVE
Ketones, UA: NEGATIVE
Leukocytes, UA: NEGATIVE
Nitrite, UA: NEGATIVE
PH UA: 6 (ref 5.0–8.0)
POC,PROTEIN,UA: NEGATIVE
Spec Grav, UA: 1.015 (ref 1.010–1.025)
UROBILINOGEN UA: 0.2 U/dL

## 2018-09-29 NOTE — Progress Notes (Signed)
ROB- pt is doing well 

## 2018-09-29 NOTE — Progress Notes (Signed)
ROB- doing well cultures next visit.Fusion iron samples given. She had not started them yet.

## 2018-10-05 ENCOUNTER — Encounter: Payer: BLUE CROSS/BLUE SHIELD | Admitting: Physical Therapy

## 2018-10-05 ENCOUNTER — Other Ambulatory Visit (HOSPITAL_COMMUNITY)
Admission: RE | Admit: 2018-10-05 | Discharge: 2018-10-05 | Disposition: A | Discharge status: Home / Self Care | Payer: BLUE CROSS/BLUE SHIELD | Source: Ambulatory Visit | Attending: Psychologist | Admitting: Psychologist

## 2018-10-05 ENCOUNTER — Ambulatory Visit (INDEPENDENT_AMBULATORY_CARE_PROVIDER_SITE_OTHER): Payer: BLUE CROSS/BLUE SHIELD | Admitting: Certified Nurse Midwife

## 2018-10-05 VITALS — BP 103/50 | HR 79 | Wt 141.3 lb

## 2018-10-05 DIAGNOSIS — N898 Other specified noninflammatory disorders of vagina: Secondary | ICD-10-CM

## 2018-10-05 DIAGNOSIS — Z3493 Encounter for supervision of normal pregnancy, unspecified, third trimester: Secondary | ICD-10-CM

## 2018-10-05 DIAGNOSIS — Z3A34 34 weeks gestation of pregnancy: Secondary | ICD-10-CM

## 2018-10-05 DIAGNOSIS — O26893 Other specified pregnancy related conditions, third trimester: Secondary | ICD-10-CM

## 2018-10-05 LAB — POCT URINALYSIS DIPSTICK OB
Bilirubin, UA: NEGATIVE
Blood, UA: NEGATIVE
Ketones, UA: NEGATIVE
Leukocytes, UA: NEGATIVE
Nitrite, UA: NEGATIVE
POC,PROTEIN,UA: NEGATIVE
Spec Grav, UA: 1.015 (ref 1.010–1.025)
Urobilinogen, UA: 0.2 E.U./dL
pH, UA: 7.5 (ref 5.0–8.0)

## 2018-10-05 MED ORDER — FLUCONAZOLE 150 MG PO TABS: 150.0000 mg | ORAL_TABLET | Freq: Once | ORAL | 1 refills | Status: AC

## 2018-10-05 NOTE — Patient Instructions (Signed)
Common Medications Safe in Pregnancy  Acne:      Constipation:  Benzoyl Peroxide     Colace  Clindamycin      Dulcolax Suppository  Topica Erythromycin     Fibercon  Salicylic Acid      Metamucil         Miralax AVOID:        Senakot   Accutane    Cough:  Retin-A       Cough Drops  Tetracycline      Phenergan w/ Codeine if Rx  Minocycline      Robitussin (Plain & DM)  Antibiotics:     Crabs/Lice:  Ceclor       RID  Cephalosporins    AVOID:  E-Mycins      Kwell  Keflex  Macrobid/Macrodantin   Diarrhea:  Penicillin      Kao-Pectate  Zithromax      Imodium AD         PUSH FLUIDS AVOID:       Cipro     Fever:  Tetracycline      Tylenol (Regular or Extra  Minocycline       Strength)  Levaquin      Extra Strength-Do not          Exceed 8 tabs/24 hrs Caffeine:        <200mg/day (equiv. To 1 cup of coffee or  approx. 3 12 oz sodas)         Gas: Cold/Hayfever:       Gas-X  Benadryl      Mylicon  Claritin       Phazyme  **Claritin-D        Chlor-Trimeton    Headaches:  Dimetapp      ASA-Free Excedrin  Drixoral-Non-Drowsy     Cold Compress  Mucinex (Guaifenasin)     Tylenol (Regular or Extra  Sudafed/Sudafed-12 Hour     Strength)  **Sudafed PE Pseudoephedrine   Tylenol Cold & Sinus     Vicks Vapor Rub  Zyrtec  **AVOID if Problems With Blood Pressure         Heartburn: Avoid lying down for at least 1 hour after meals  Aciphex      Maalox     Rash:  Milk of Magnesia     Benadryl    Mylanta       1% Hydrocortisone Cream  Pepcid  Pepcid Complete   Sleep Aids:  Prevacid      Ambien   Prilosec       Benadryl  Rolaids       Chamomile Tea  Tums (Limit 4/day)     Unisom  Zantac       Tylenol PM         Warm milk-add vanilla or  Hemorrhoids:       Sugar for taste  Anusol/Anusol H.C.  (RX: Analapram 2.5%)  Sugar Substitutes:  Hydrocortisone OTC     Ok in moderation  Preparation H      Tucks        Vaseline lotion applied to tissue with  wiping    Herpes:     Throat:  Acyclovir      Oragel  Famvir  Valtrex     Vaccines:         Flu Shot Leg Cramps:       *Gardasil  Benadryl      Hepatitis A         Hepatitis B Nasal Spray:         Pneumovax  Saline Nasal Spray     Polio Booster         Tetanus Nausea:       Tuberculosis test or PPD  Vitamin B6 25 mg TID   AVOID:    Dramamine      *Gardasil  Emetrol       Live Poliovirus  Ginger Root 250 mg QID    MMR (measles, mumps &  High Complex Carbs @ Bedtime    rebella)  Sea Bands-Accupressure    Varicella (Chickenpox)  Unisom 1/2 tab TID     *No known complications           If received before Pain:         Known pregnancy;   Darvocet       Resume series after  Lortab        Delivery  Percocet    Yeast:   Tramadol      Femstat  Tylenol 3      Gyne-lotrimin  Ultram       Monistat  Vicodin           MISC:         All Sunscreens           Hair Coloring/highlights          Insect Repellant's          (Including DEET)         Mystic Tans Vaginitis  Vaginitis is irritation and swelling (inflammation) of the vagina. It happens when normal bacteria and yeast in the vagina grow too much. There are many types of this condition. Treatment will depend on the type you have. Follow these instructions at home: Lifestyle  Keep your vagina area clean and dry. ? Avoid using soap. ? Rinse the area with water.  Do not do the following until your doctor says it is okay: ? Wash and clean out the vagina (douche). ? Use tampons. ? Have sex.  Wipe from front to back after going to the bathroom.  Let air reach your vagina. ? Wear cotton underwear. ? Do not wear: ? Underwear while you sleep. ? Tight pants. ? Thong underwear. ? Underwear or nylons without a cotton panel. ? Take off any wet clothing, such as bathing suits, as soon as possible.  Use gentle, non-scented products. Do not use things that can irritate the vagina, such as fabric softeners. Avoid the following products  if they are scented: ? Feminine sprays. ? Detergents. ? Tampons. ? Feminine hygiene products. ? Soaps or bubble baths.  Practice safe sex and use condoms. General instructions  Take over-the-counter and prescription medicines only as told by your doctor.  If you were prescribed an antibiotic medicine, take or use it as told by your doctor. Do not stop taking or using the antibiotic even if you start to feel better.  Keep all follow-up visits as told by your doctor. This is important. Contact a doctor if:  You have pain in your belly.  You have a fever.  Your symptoms last for more than 2-3 days. Get help right away if:  You have a fever and your symptoms get worse all of a sudden. Summary  Vaginitis is irritation and swelling of the vagina. It can happen when the normal bacteria and yeast in the vagina grow too much. There are many types.  Treatment will depend on the type you have.  Do not douche, use tampons , or have sex until your  health care provider approves. When you can return to sex, practice safe sex and use condoms. This information is not intended to replace advice given to you by your health care provider. Make sure you discuss any questions you have with your health care provider. Document Released: 10/15/2008 Document Revised: 08/10/2016 Document Reviewed: 08/10/2016 Elsevier Interactive Patient Education  2019 [employer].

## 2018-10-05 NOTE — Progress Notes (Signed)
Subjective:   Amber Herrera is a 27 y.o. [id number] [redacted]w[redacted]d being seen today for her obstetrical visit.  Patient reports vaginal itching accompanied by vulvar itching and swelling.  Denies contractions, vaginal bleeding or leaking of fluid.  Reports good fetal movement.  The following portions of the patient's history were reviewed and updated as appropriate: allergies, current medications, past family history, past medical history, past social history, past surgical history and problem list.   Objective:   BP (!) 103/50   Pulse 79   Wt 141 lb 5 oz (64.1 kg)   LMP 02/13/2018 (Exact Date)   BMI 22.81 kg/m   FHT: Fetal Heart Rate (bpm): 140  Uterine Size: Fundal Height: 34 cm  Fetal Movement: Movement: Present  Presentation: Presentation: Vertex   Abdomen:  soft, gravid, appropriate for gestational age,non-tender  Vaginal:  Discharge, white   erythema  Cervix: 1cm,Long,-3, firm,posterior    Results for orders placed or performed in visit on 10/05/18 (from the past 24 hour(s))  POC Urinalysis Dipstick OB     Status: Abnormal   Collection Time: 10/05/18  9:55 AM  Result Value Ref Range   Color, UA amber    Clarity, UA clear    Glucose, UA Small (1+) (A) Negative   Bilirubin, UA neg    Ketones, UA neg    Spec Grav, UA 1.015 1.010 - 1.025   Blood, UA neg    pH, UA 7.5 5.0 - 8.0   POC,PROTEIN,UA Negative Negative, Trace, Small (1+), Moderate (2+), Large (3+), 4+   Urobilinogen, UA 0.2 0.2 or 1.0 E.U./dL   Nitrite, UA neg    Leukocytes, UA Negative Negative   Appearance     Odor      Assessment:   Pregnancy:  G4P3003 at [redacted]w[redacted]d  1. Third trimester pregnancy  - POC Urinalysis Dipstick OB  2. Vaginal discharge during pregnancy  Plan:   Vaginal swab collected, see orders. Will contact patient with results.   Dicussed home treatment measures.   Preterm labor symptoms: vaginal bleeding, contractions and leaking of fluid reviewed in detail.  Fetal movement precautions  reviewed.  Follow up in 2-3 weeks for 36 week cultures and ROB or sooner if needed.   Gunnar Bulla, CNM [facility], [facility]

## 2018-10-10 ENCOUNTER — Telehealth: Payer: Self-pay

## 2018-10-10 ENCOUNTER — Other Ambulatory Visit: Payer: Self-pay

## 2018-10-10 LAB — CERVICOVAGINAL ANCILLARY ONLY
Bacterial vaginitis: POSITIVE — AB
Candida vaginitis: POSITIVE — AB

## 2018-10-10 MED ORDER — METRONIDAZOLE 500 MG PO TABS: 500.0000 mg | ORAL_TABLET | Freq: Two times a day (BID) | ORAL | 0 refills | Status: DC

## 2018-10-10 NOTE — Progress Notes (Signed)
Please contact patient. Vaginal swab positive for bacterial vaginosis and yeast. May have prescriptions for Flagyl and Diflucan. Thanks, JML

## 2018-10-10 NOTE — Telephone Encounter (Signed)
Pt informed of MLs orders and script sent to pharmacy.

## 2018-10-12 ENCOUNTER — Encounter: Payer: BLUE CROSS/BLUE SHIELD | Admitting: Physical Therapy

## 2018-10-13 ENCOUNTER — Encounter: Payer: BLUE CROSS/BLUE SHIELD | Admitting: Certified Nurse Midwife

## 2018-10-19 ENCOUNTER — Encounter: Payer: BLUE CROSS/BLUE SHIELD | Admitting: Physical Therapy

## 2018-10-20 ENCOUNTER — Ambulatory Visit (INDEPENDENT_AMBULATORY_CARE_PROVIDER_SITE_OTHER): Payer: BLUE CROSS/BLUE SHIELD | Admitting: Certified Nurse Midwife

## 2018-10-20 ENCOUNTER — Other Ambulatory Visit: Payer: Self-pay

## 2018-10-20 ENCOUNTER — Other Ambulatory Visit (HOSPITAL_COMMUNITY)
Admission: RE | Admit: 2018-10-20 | Discharge: 2018-10-20 | Disposition: A | Discharge status: Home / Self Care | Payer: BLUE CROSS/BLUE SHIELD | Source: Ambulatory Visit | Attending: Certified Nurse Midwife | Admitting: Certified Nurse Midwife

## 2018-10-20 VITALS — BP 119/59 | HR 89 | Wt 142.4 lb

## 2018-10-20 DIAGNOSIS — O26893 Other specified pregnancy related conditions, third trimester: Secondary | ICD-10-CM | POA: Diagnosis present

## 2018-10-20 DIAGNOSIS — Z3493 Encounter for supervision of normal pregnancy, unspecified, third trimester: Secondary | ICD-10-CM

## 2018-10-20 DIAGNOSIS — N898 Other specified noninflammatory disorders of vagina: Secondary | ICD-10-CM | POA: Diagnosis present

## 2018-10-20 DIAGNOSIS — Z369 Encounter for antenatal screening, unspecified: Secondary | ICD-10-CM | POA: Insufficient documentation

## 2018-10-20 LAB — POCT URINALYSIS DIPSTICK OB
Bilirubin, UA: NEGATIVE
Blood, UA: NEGATIVE
GLUCOSE, UA: NEGATIVE
Leukocytes, UA: NEGATIVE
Nitrite, UA: NEGATIVE
POC,PROTEIN,UA: NEGATIVE
Spec Grav, UA: 1.02 (ref 1.010–1.025)
Urobilinogen, UA: 0.2 E.U./dL
pH, UA: 6 (ref 5.0–8.0)

## 2018-10-20 NOTE — Patient Instructions (Signed)

## 2018-10-20 NOTE — Progress Notes (Signed)
ROB-Doing well, reports increased vaginal discharge. GBS cultures and vaginal swab collected today. Discussed COVID-19 precautions and visitor restrictions. Encouraged kick counts twice daily. Reviewed red flag symptoms and when to call. RTC x 1-2 weeks for ROB or sooner if needed.

## 2018-10-23 ENCOUNTER — Other Ambulatory Visit
Admission: RE | Admit: 2018-10-23 | Discharge: 2018-10-23 | Disposition: A | Discharge status: Home / Self Care | Payer: BLUE CROSS/BLUE SHIELD | Source: Ambulatory Visit | Attending: Certified Nurse Midwife | Admitting: Certified Nurse Midwife

## 2018-10-23 ENCOUNTER — Telehealth: Payer: Self-pay

## 2018-10-23 DIAGNOSIS — Z3493 Encounter for supervision of normal pregnancy, unspecified, third trimester: Secondary | ICD-10-CM | POA: Insufficient documentation

## 2018-10-23 LAB — CERVICOVAGINAL ANCILLARY ONLY
Bacterial vaginitis: POSITIVE — AB
CANDIDA VAGINITIS: NEGATIVE
Chlamydia: NEGATIVE
Neisseria Gonorrhea: NEGATIVE

## 2018-10-23 NOTE — Progress Notes (Signed)
Please contact patient. Has BV. Should take the meds that she didn't take last time. No new Rx needed. JML

## 2018-10-23 NOTE — Telephone Encounter (Signed)
Pt informed of test results ( BV) and she should take the meds she was prescribed last time per AT.

## 2018-10-25 LAB — CULTURE, BETA STREP (GROUP B ONLY)

## 2018-10-26 ENCOUNTER — Encounter: Payer: BLUE CROSS/BLUE SHIELD | Admitting: Physical Therapy

## 2018-10-27 ENCOUNTER — Telehealth: Payer: Self-pay

## 2018-10-27 NOTE — Telephone Encounter (Signed)
LMTRC needs to be prescreened.  

## 2018-10-30 ENCOUNTER — Ambulatory Visit (INDEPENDENT_AMBULATORY_CARE_PROVIDER_SITE_OTHER): Payer: BLUE CROSS/BLUE SHIELD | Admitting: Certified Nurse Midwife

## 2018-10-30 ENCOUNTER — Other Ambulatory Visit: Payer: Self-pay

## 2018-10-30 VITALS — BP 108/54 | HR 86 | Wt 145.1 lb

## 2018-10-30 DIAGNOSIS — Z3493 Encounter for supervision of normal pregnancy, unspecified, third trimester: Secondary | ICD-10-CM

## 2018-10-30 NOTE — Progress Notes (Signed)
ROB-"Ready to have baby". Notes anxiety due to COVID, reassurance offered and precautions reiterated. Discussed GBS negative status, verbalized understanding. Request my presence at birth, advised would attend if possible. Encouraged twice daily kick counts. Anticipatory guidance regarding course of prenatal care. Reviewed red flag symptoms and when to call. RTC x 2 weeks for ROB or sooner if needed.

## 2018-10-30 NOTE — Patient Instructions (Signed)

## 2018-10-30 NOTE — Progress Notes (Signed)
Pt would like to be checked

## 2018-11-01 ENCOUNTER — Observation Stay
Admission: EM | Admit: 2018-11-01 | Discharge: 2018-11-01 | Disposition: A | Discharge status: Home / Self Care | Payer: BLUE CROSS/BLUE SHIELD | Attending: Obstetrics and Gynecology | Admitting: Obstetrics and Gynecology

## 2018-11-01 ENCOUNTER — Other Ambulatory Visit: Payer: Self-pay

## 2018-11-01 DIAGNOSIS — O26893 Other specified pregnancy related conditions, third trimester: Secondary | ICD-10-CM | POA: Diagnosis not present

## 2018-11-01 DIAGNOSIS — D649 Anemia, unspecified: Secondary | ICD-10-CM | POA: Insufficient documentation

## 2018-11-01 DIAGNOSIS — Z3A38 38 weeks gestation of pregnancy: Secondary | ICD-10-CM

## 2018-11-01 DIAGNOSIS — O99013 Anemia complicating pregnancy, third trimester: Secondary | ICD-10-CM

## 2018-11-01 DIAGNOSIS — M545 Low back pain: Secondary | ICD-10-CM

## 2018-11-01 DIAGNOSIS — Z3490 Encounter for supervision of normal pregnancy, unspecified, unspecified trimester: Secondary | ICD-10-CM

## 2018-11-01 DIAGNOSIS — R102 Pelvic and perineal pain: Secondary | ICD-10-CM | POA: Diagnosis not present

## 2018-11-01 DIAGNOSIS — O09293 Supervision of pregnancy with other poor reproductive or obstetric history, third trimester: Secondary | ICD-10-CM

## 2018-11-01 DIAGNOSIS — Z8759 Personal history of other complications of pregnancy, childbirth and the puerperium: Secondary | ICD-10-CM

## 2018-11-01 NOTE — OB Triage Note (Signed)
Patient came in c/o of abdominal pressure that started on 10/30/18 after her office visit but today abdominal pressure increased in pain. Reports occasional contractions since today. Reports losing mucous plus on 10/30/18. Denies vaginal bleeding, denies LOF and reports positive FM. Initial BP of 124/73. Temp. 98.32F.

## 2018-11-01 NOTE — Discharge Planning (Signed)
Patient discharge to home with education on false labor versus true labor. Patient was reassured and given instruction of when to return along with labor and delivery phone number if any questions overnight. Verbal orders from provider to  Discharge patient home with labor precautions and midwife to call patient in the morning. Patient agreed to plan of care and all questions answered. Patient in discharge stable condition.

## 2018-11-02 ENCOUNTER — Other Ambulatory Visit: Payer: Self-pay | Admitting: Certified Nurse Midwife

## 2018-11-02 ENCOUNTER — Encounter: Payer: BLUE CROSS/BLUE SHIELD | Admitting: Physical Therapy

## 2018-11-02 ENCOUNTER — Telehealth: Payer: Self-pay | Admitting: Certified Nurse Midwife

## 2018-11-02 DIAGNOSIS — Z8759 Personal history of other complications of pregnancy, childbirth and the puerperium: Secondary | ICD-10-CM

## 2018-11-02 NOTE — Discharge Summary (Signed)
  Obstetric Discharge Summary  Patient ID: Amber Herrera MRN: [id number] DOB/AGE: 1990-09-14 28 y.o.   Date of Admission: 11/01/2018  Date of Discharge: 11/01/2018  Admitting Diagnosis: Observation at [redacted]w[redacted]d  Secondary Diagnosis: Pelvic pain in pregnancy, Low back pain in pregnancy, Anemia in pregnancy, History of precipitous delivery  Discharge Diagnosis: No other diagnosis   Antepartum Procedures: NST    Brief Hospital Course   L&D OB Triage Note  Amber Herrera is a 28 y.o. G64P3003 female at [redacted]w[redacted]d, EDD Estimated Date of Delivery: 11/13/18 who presented to triage for complaints of abdominal pressure and occasional contractions.  She was evaluated by the nurses with no significant findings for active labor or fetal distress. Vital signs stable. An NST was performed and has been reviewed by CNM.    NST INTERPRETATION:  Indications: rule out uterine contractions  Mode: External Baseline Rate (A): 135 bpm Variability: Moderate Accelerations: 15 x 15 Decelerations: Variable Contraction Frequency (min): 6-8  Impression: reactive   Plan: NST performed was reviewed and was found to be reactive. She was discharged home with bleeding/labor precautions.  Continue routine prenatal care. Follow up with OB/GYN as previously scheduled.   Discharge Instructions: Per After Visit Summary.  Activity: Advance as tolerated.   Diet: Regular  Medications: Allergies as of 11/01/2018   No Known Allergies     Medication List    ASK your doctor about these medications   prenatal multivitamin Tabs tablet Take 1 tablet by mouth daily at 12 noon.      Outpatient follow up:  Postpartum contraception: IUD  Discharged Condition: stable  Discharged to: home   Gunnar Bulla, CNM [facility], [facility]

## 2018-11-02 NOTE — Telephone Encounter (Signed)
1057 Telephone call to patient, verified full name and date of birth.   Advised induction of labor scheduled for Monday, 11/06/2018 at 0800.   Reviewed red flag symptoms and when to call.    Gunnar Bulla, CNM [facility], [facility] 11/02/18 10:58 AM

## 2018-11-05 ENCOUNTER — Inpatient Hospital Stay
Admission: EM | Admit: 2018-11-05 | Discharge: 2018-11-06 | DRG: 807 | Disposition: A | Discharge status: Home / Self Care | Payer: BLUE CROSS/BLUE SHIELD | Attending: Certified Nurse Midwife | Admitting: Certified Nurse Midwife

## 2018-11-05 ENCOUNTER — Other Ambulatory Visit: Payer: Self-pay

## 2018-11-05 DIAGNOSIS — O9902 Anemia complicating childbirth: Secondary | ICD-10-CM

## 2018-11-05 DIAGNOSIS — Z3A38 38 weeks gestation of pregnancy: Secondary | ICD-10-CM

## 2018-11-05 DIAGNOSIS — O26893 Other specified pregnancy related conditions, third trimester: Secondary | ICD-10-CM | POA: Diagnosis present

## 2018-11-05 DIAGNOSIS — D649 Anemia, unspecified: Secondary | ICD-10-CM | POA: Diagnosis present

## 2018-11-05 LAB — TYPE AND SCREEN
ABO/RH(D): A POS
Antibody Screen: NEGATIVE

## 2018-11-05 LAB — CBC
HCT: 34.4 % — ABNORMAL LOW (ref 36.0–46.0)
Hemoglobin: 10.4 g/dL — ABNORMAL LOW (ref 12.0–15.0)
MCH: 26.7 pg (ref 26.0–34.0)
MCHC: 30.2 g/dL (ref 30.0–36.0)
MCV: 88.2 fL (ref 80.0–100.0)
Platelets: 185 10*3/uL (ref 150–400)
RBC: 3.9 MIL/uL (ref 3.87–5.11)
RDW: 15.2 % (ref 11.5–15.5)
WBC: 8.6 10*3/uL (ref 4.0–10.5)
nRBC: 0 % (ref 0.0–0.2)

## 2018-11-05 MED ORDER — OXYTOCIN 40 UNITS IN NORMAL SALINE INFUSION - SIMPLE MED: 2.5000 [IU]/h | INTRAVENOUS | Status: DC

## 2018-11-05 MED ORDER — SENNOSIDES-DOCUSATE SODIUM 8.6-50 MG PO TABS
2.0000 | ORAL_TABLET | ORAL | Status: DC
  Administered 2018-11-06: 2 via ORAL
  Filled 2018-11-05: qty 2

## 2018-11-05 MED ORDER — BENZOCAINE-MENTHOL 20-0.5 % EX AERO
1.0000 "application " | INHALATION_SPRAY | CUTANEOUS | Status: DC | PRN
  Administered 2018-11-06: 1 via TOPICAL
  Filled 2018-11-05: qty 56

## 2018-11-05 MED ORDER — METHYLERGONOVINE MALEATE 0.2 MG PO TABS: 0.2000 mg | ORAL_TABLET | ORAL | Status: DC | PRN

## 2018-11-05 MED ORDER — SODIUM CHLORIDE 0.9 % IV SOLN: 250.0000 mL | INTRAVENOUS | Status: DC | PRN

## 2018-11-05 MED ORDER — SIMETHICONE 80 MG PO CHEW: 80.0000 mg | CHEWABLE_TABLET | ORAL | Status: DC | PRN

## 2018-11-05 MED ORDER — DIBUCAINE 1 % RE OINT: 1.0000 "application " | TOPICAL_OINTMENT | RECTAL | Status: DC | PRN

## 2018-11-05 MED ORDER — BUTORPHANOL TARTRATE 1 MG/ML IJ SOLN: 1.0000 mg | INTRAMUSCULAR | Status: DC | PRN

## 2018-11-05 MED ORDER — COCONUT OIL OIL
1.0000 "application " | TOPICAL_OIL | Status: DC | PRN
  Administered 2018-11-06: 1 via TOPICAL
  Filled 2018-11-05: qty 120

## 2018-11-05 MED ORDER — ONDANSETRON HCL 4 MG PO TABS: 4.0000 mg | ORAL_TABLET | ORAL | Status: DC | PRN

## 2018-11-05 MED ORDER — ACETAMINOPHEN 325 MG PO TABS: 650.0000 mg | ORAL_TABLET | ORAL | Status: DC | PRN

## 2018-11-05 MED ORDER — OXYCODONE-ACETAMINOPHEN 5-325 MG PO TABS: 2.0000 | ORAL_TABLET | ORAL | Status: DC | PRN

## 2018-11-05 MED ORDER — LACTATED RINGERS IV SOLN: INTRAVENOUS | Status: DC

## 2018-11-05 MED ORDER — LACTATED RINGERS IV SOLN: 500.0000 mL | INTRAVENOUS | Status: DC | PRN

## 2018-11-05 MED ORDER — SOD CITRATE-CITRIC ACID 500-334 MG/5ML PO SOLN: 30.0000 mL | ORAL | Status: DC | PRN

## 2018-11-05 MED ORDER — ONDANSETRON HCL 4 MG/2ML IJ SOLN: 4.0000 mg | Freq: Four times a day (QID) | INTRAMUSCULAR | Status: DC | PRN

## 2018-11-05 MED ORDER — LIDOCAINE HCL (PF) 1 % IJ SOLN: 30.0000 mL | INTRAMUSCULAR | Status: DC | PRN

## 2018-11-05 MED ORDER — OXYTOCIN BOLUS FROM INFUSION: 500.0000 mL | Freq: Once | INTRAVENOUS | Status: DC

## 2018-11-05 MED ORDER — DIPHENHYDRAMINE HCL 25 MG PO CAPS: 25.0000 mg | ORAL_CAPSULE | Freq: Four times a day (QID) | ORAL | Status: DC | PRN

## 2018-11-05 MED ORDER — AMMONIA AROMATIC IN INHA
RESPIRATORY_TRACT | Status: AC
  Filled 2018-11-05: qty 10

## 2018-11-05 MED ORDER — WITCH HAZEL-GLYCERIN EX PADS: 1.0000 "application " | MEDICATED_PAD | CUTANEOUS | Status: DC | PRN

## 2018-11-05 MED ORDER — OXYTOCIN 40 UNITS IN NORMAL SALINE INFUSION - SIMPLE MED: 1.0000 m[IU]/min | INTRAVENOUS | Status: DC

## 2018-11-05 MED ORDER — LIDOCAINE HCL (PF) 1 % IJ SOLN
INTRAMUSCULAR | Status: AC
  Filled 2018-11-05: qty 30

## 2018-11-05 MED ORDER — SODIUM CHLORIDE 0.9% FLUSH: 3.0000 mL | Freq: Two times a day (BID) | INTRAVENOUS | Status: DC

## 2018-11-05 MED ORDER — ACETAMINOPHEN 325 MG PO TABS
650.0000 mg | ORAL_TABLET | ORAL | Status: DC | PRN
  Administered 2018-11-05 – 2018-11-06 (×4): 650 mg via ORAL
  Filled 2018-11-05 (×4): qty 2

## 2018-11-05 MED ORDER — PRENATAL MULTIVITAMIN CH
1.0000 | ORAL_TABLET | Freq: Every day | ORAL | Status: DC
  Administered 2018-11-06: 1 via ORAL
  Filled 2018-11-05: qty 1

## 2018-11-05 MED ORDER — MISOPROSTOL 200 MCG PO TABS
ORAL_TABLET | ORAL | Status: AC
  Filled 2018-11-05: qty 4

## 2018-11-05 MED ORDER — METHYLERGONOVINE MALEATE 0.2 MG/ML IJ SOLN: 0.2000 mg | INTRAMUSCULAR | Status: DC | PRN

## 2018-11-05 MED ORDER — ONDANSETRON HCL 4 MG/2ML IJ SOLN: 4.0000 mg | INTRAMUSCULAR | Status: DC | PRN

## 2018-11-05 MED ORDER — OXYCODONE-ACETAMINOPHEN 5-325 MG PO TABS: 1.0000 | ORAL_TABLET | ORAL | Status: DC | PRN

## 2018-11-05 MED ORDER — ZOLPIDEM TARTRATE 5 MG PO TABS: 5.0000 mg | ORAL_TABLET | Freq: Every evening | ORAL | Status: DC | PRN

## 2018-11-05 MED ORDER — TERBUTALINE SULFATE 1 MG/ML IJ SOLN: 0.2500 mg | Freq: Once | INTRAMUSCULAR | Status: DC | PRN

## 2018-11-05 MED ORDER — OXYTOCIN 10 UNIT/ML IJ SOLN
INTRAMUSCULAR | Status: AC
  Administered 2018-11-05: 14:00:00
  Filled 2018-11-05: qty 2

## 2018-11-05 MED ORDER — IBUPROFEN 600 MG PO TABS
600.0000 mg | ORAL_TABLET | Freq: Four times a day (QID) | ORAL | Status: DC
  Administered 2018-11-05 – 2018-11-06 (×4): 600 mg via ORAL
  Filled 2018-11-05 (×4): qty 1

## 2018-11-05 MED ORDER — SODIUM CHLORIDE 0.9% FLUSH: 3.0000 mL | INTRAVENOUS | Status: DC | PRN

## 2018-11-05 MED ORDER — OXYTOCIN 40 UNITS IN NORMAL SALINE INFUSION - SIMPLE MED
INTRAVENOUS | Status: AC
  Filled 2018-11-05: qty 1000

## 2018-11-05 NOTE — H&P (Signed)
Obstetric History and Physical  Amber Herrera is a 28 y.o. [id number] with IUP at [redacted]w[redacted]d presenting with contractions. Patient states she has been having  regular, every five (5) minutes contractions, none vaginal bleeding, intact membranes, with active fetal movement.    Denies difficulty breathing or respiratory distress, chest pain, dysuria, and leg pain or swelling.   Prenatal Course  Source of Care: [facility]-initial visit at 12 wks, transfer of care from [facility], total visits: 11  Pregnancy complications or risks: History of precipitous birth, Anemia in pregnancy, Low back pain in pregnancy, pelvic pain in pregnancy  Prenatal labs and studies:  ABO, Rh: --/--/A POS (04/05 7408)  Antibody: NEG (04/05 0438)  Rubella: Immune (09/17 0000)  Varicella: Immune (09/17 0000)  RPR: Non Reactive (01/14 1017)   HBsAg: Negative (09/17 0000)   HIV: Non-reactive, NR (09/17 0000)   GBS: Negative (03/20 1047)  1 hr Glucola: 61 (01/14 1017)  Genetic screening: Declined  Anatomy US: Complete, normal (12/05 1051)  History reviewed. No pertinent past medical history.  No past surgical history on file.  OB History  Gravida Para Term Preterm AB Living  4 3 3     3   SAB TAB Ectopic Multiple Live Births          3    # Outcome Date GA Lbr Len/2nd Weight Sex Delivery Anes PTL Lv  4 Current           3 Term 2016    F Vag-Spont   LIV  2 Term 2012    F Vag-Spont   LIV  1 Term 2008    F Vag-Spont   LIV    Social History   Socioeconomic History  . Marital status: Married    Spouse name: Not on file  . Number of children: Not on file  . Years of education: Not on file  . Highest education level: Not on file  Occupational History  . Not on file  Social Needs  . Financial resource strain: Not on file  . Food insecurity:    Worry: Not on file    Inability: Not on file  . Transportation needs:    Medical: Not on file    Non-medical: Not on file  Tobacco Use  . Smoking  status: Never Smoker  . Smokeless tobacco: Never Used  Substance and Sexual Activity  . Alcohol use: Never    Frequency: Never  . Drug use: Never  . Sexual activity: Not on file  Lifestyle  . Physical activity:    Days per week: Not on file    Minutes per session: Not on file  . Stress: Not on file  Relationships  . Social connections:    Talks on phone: Not on file    Gets together: Not on file    Attends religious service: Not on file    Active member of club or organization: Not on file    Attends meetings of clubs or organizations: Not on file    Relationship status: Not on file  Other Topics Concern  . Not on file  Social History Narrative  . Not on file    No family history on file.  Medications Prior to Admission  Medication Sig Dispense Refill Last Dose  . Prenatal Vit-Fe Fumarate-FA (PRENATAL MULTIVITAMIN) TABS tablet Take 1 tablet by mouth daily at 12 noon.   11/01/2018    No Known Allergies  Review of Systems: Negative except for what is mentioned in HPI.  Physical Exam:  Temp:  [98.2 F (36.8 C)] 98.2 F (36.8 C) (04/05 0400) Pulse Rate:  [80-92] 80 (04/05 0511) Resp:  [16-18] 16 (04/05 0511) BP: (103-120)/(59-72) 103/59 (04/05 0511) Weight:  [65.8 kg] 65.8 kg (04/05 0400)  GENERAL: Well-developed, well-nourished female in no acute distress.   LUNGS: Clear to auscultation bilaterally.   HEART: Regular rate and rhythm.  ABDOMEN: Soft, nontender, nondistended, gravid.  EXTREMITIES: Nontender, no edema, 2+ distal pulses.  Cervical Exam: Dilation: 6.5 Effacement (%): 90 Station: -1 Presentation: Vertex Exam by:: P Rumley,RN  FHT:  Baseline rate 130 bpm   Variability moderate  Accelerations present   Decelerations none  Contractions: Every two (2) to three (3) minutes, soft resting tone   Pertinent Labs/Studies:   Results for orders placed or performed during the hospital encounter of 11/05/18 (from the past 24 hour(s))  CBC     Status:  Abnormal   Collection Time: 11/05/18  4:38 AM  Result Value Ref Range   WBC 8.6 4.0 - 10.5 K/uL   RBC 3.90 3.87 - 5.11 MIL/uL   Hemoglobin 10.4 (L) 12.0 - 15.0 g/dL   HCT 86.5 (L) 78.4 - 69.6 %   MCV 88.2 80.0 - 100.0 fL   MCH 26.7 26.0 - 34.0 pg   MCHC 30.2 30.0 - 36.0 g/dL   RDW 29.5 28.4 - 13.2 %   Platelets 185 150 - 400 K/uL   nRBC 0.0 0.0 - 0.2 %  Type and screen     Status: None   Collection Time: 11/05/18  4:38 AM  Result Value Ref Range   ABO/RH(D) A POS    Antibody Screen NEG    Sample Expiration      11/08/2018 Performed at [facility], [address], [place], [place] [address]     Assessment :  Amber Herrera is a 28 y.o. [phone] at [redacted]w[redacted]d being admitted for labor, Rh positive, Anemia in pregnancy, History of precipitous birth, GBS negative  FHR Category I  Plan:  Admit to birthing suites, see orders.   Labor: Expectant management.  Induction/Augmentation as needed, per protocol.  Delivery plan: Hopeful for vaginal delivery.   Dr Valentino Saxon notified of admission and plan of care.    Gunnar Bulla, CNM [facility], [facility] 11/05/18 7:20 AM

## 2018-11-05 NOTE — OB Triage Note (Signed)
Pt c/o CTX starting at 0200 11/05/2018 q5 min.  No LOF or bleeding.  Pt reports positive fetal movement.

## 2018-11-05 NOTE — Progress Notes (Signed)
Patient ID: Amber Herrera, female   DOB: May 29, 1991, 28 y.o.   MRN: [id number]  Amber Herrera is a 28 y.o. G4P3003 at [redacted]w[redacted]d by ultrasound admitted for active labor  Subjective:  Patient sitting in bed, reports decreased contractions frequency and intensity. FOB at bedside for continuous labor support.   Denies difficulty breathing or respiratory distress, chest pain, abdominal pain, vaginal bleeding, dysuria, and leg pain or swelling.   Objective:  Temp:  [98.2 F (36.8 C)] 98.2 F (36.8 C) (04/05 0400) Pulse Rate:  [80-92] 80 (04/05 0511) Resp:  [16-18] 16 (04/05 0511) BP: (103-120)/(59-72) 103/59 (04/05 0511) Weight:  [65.8 kg] 65.8 kg (04/05 0400)  Fetal Wellbeing:  Category I  UC:   regular, every one (1) to three (3) minutes; soft resting tone  SVE:   Dilation: 6.5 Effacement (%): 90 Station: -1 Exam by:: Mlawhorn cnm   Labs: Lab Results  Component Value Date   WBC 8.6 11/05/2018   HGB 10.4 (L) 11/05/2018   HCT 34.4 (L) 11/05/2018   MCV 88.2 11/05/2018   PLT 185 11/05/2018    Assessment:  Amber Herrera is a 28 y.o. G4P3003 at [redacted]w[redacted]d being admitted for labor, Rh positive, Anemia in pregnancy, History of precipitous birth, GBS negative  FHR Category I  Plan:  Encouraged position change and use of peanut/birthing ball.   Decision to open water in approximately one (1) hour if no change in contractions frequency and intensity.   Reviewed red flag symptoms and when to call.   Continue orders as written. Reassess as needed.    Gunnar Bulla, CNM [facility], [facility] 11/05/2018, 8:28 AM

## 2018-11-06 LAB — CBC
HCT: 33.9 % — ABNORMAL LOW (ref 36.0–46.0)
Hemoglobin: 10.3 g/dL — ABNORMAL LOW (ref 12.0–15.0)
MCH: 26.8 pg (ref 26.0–34.0)
MCHC: 30.4 g/dL (ref 30.0–36.0)
MCV: 88.3 fL (ref 80.0–100.0)
Platelets: 192 10*3/uL (ref 150–400)
RBC: 3.84 MIL/uL — ABNORMAL LOW (ref 3.87–5.11)
RDW: 15.2 % (ref 11.5–15.5)
WBC: 12.3 10*3/uL — ABNORMAL HIGH (ref 4.0–10.5)
nRBC: 0 % (ref 0.0–0.2)

## 2018-11-06 LAB — RPR: RPR Ser Ql: NONREACTIVE

## 2018-11-06 MED ORDER — OXYCODONE HCL 5 MG PO TABS
5.0000 mg | ORAL_TABLET | ORAL | Status: DC | PRN
  Administered 2018-11-06: 5 mg via ORAL
  Filled 2018-11-06: qty 1

## 2018-11-06 MED ORDER — OXYCODONE HCL 5 MG PO TABS: 5.0000 mg | ORAL_TABLET | ORAL | 0 refills | Status: DC | PRN

## 2018-11-06 MED ORDER — IBUPROFEN 600 MG PO TABS: 600.0000 mg | ORAL_TABLET | Freq: Four times a day (QID) | ORAL | 0 refills | Status: DC

## 2018-11-06 NOTE — Progress Notes (Signed)
DC instructions reviewed with pt.  Verb u/o of f/u appt.

## 2018-11-06 NOTE — Progress Notes (Signed)
Discharged to home with NB

## 2018-11-06 NOTE — Discharge Summary (Signed)
  Obstetric Discharge Summary  Patient ID: Amber Herrera MRN: [id number] DOB/AGE: 12/19/1990 28 y.o.   Date of Admission: 11/05/2018  Date of Discharge:  11/06/18  Admitting Diagnosis: Onset of Labor at [redacted]w[redacted]d  Secondary Diagnosis: History of precipitous birth, Anemia in pregnancy, Low back pain in pregnancy, pelvic pain in pregnancy  Mode of Delivery: Normal spontaneous vaginal delivery     Discharge Diagnosis: No other diagnosis   Intrapartum Procedures: Atificial rupture of membranes   Post partum procedures: None  Complications: None   Brief Hospital Course   Amber Herrera is a [id number] who had a SVD on 11/05/2018;  for further details of this birth, please refer to the delivey note.  Patient had an uncomplicated postpartum course.  By time of discharge on PPD#1, her pain was controlled on oral pain medications; she had appropriate lochia and was ambulating, voiding without difficulty and tolerating regular diet.  She was deemed stable for discharge to home.    Labs: CBC Latest Ref Rng & Units 11/06/2018 11/05/2018 08/15/2018  WBC 4.0 - 10.5 K/uL 12.3(H) 8.6 8.4  Hemoglobin 12.0 - 15.0 g/dL 10.3(L) 10.4(L) 10.8(L)  Hematocrit 36.0 - 46.0 % 33.9(L) 34.4(L) 32.5(L)  Platelets 150 - 400 K/uL 192 185 222   A POS  Physical exam:   Temp:  [97.9 F (36.6 C)-98.7 F (37.1 C)] 98.4 F (36.9 C) (04/06 1132) Pulse Rate:  [66-91] 70 (04/06 1132) Resp:  [16-18] 18 (04/06 1132) BP: (96-111)/(56-70) 109/56 (04/06 1132) SpO2:  [96 %-100 %] 99 % (04/06 1132)  General: alert and no distress  Lochia: appropriate  Abdomen: soft, NT  Uterine Fundus: firm  Perineum: no significant drainage, no dehiscence, no significant erythema  Extremities: No evidence of DVT seen on physical exam. No lower extremity edema.  Discharge Instructions: Per After Visit Summary.  Activity: Advance as tolerated. Pelvic rest for 6 weeks.  Also refer to After Visit Summary  Diet:  Regular  Medications: Allergies as of 11/06/2018   No Known Allergies     Medication List    TAKE these medications   ibuprofen 600 MG tablet Commonly known as:  ADVIL,MOTRIN Take 1 tablet (600 mg total) by mouth every 6 (six) hours.   oxyCODONE 5 MG immediate release tablet Commonly known as:  Oxy IR/ROXICODONE Take 1 tablet (5 mg total) by mouth every 4 (four) hours as needed for severe pain or breakthrough pain.   prenatal multivitamin Tabs tablet Take 1 tablet by mouth daily at 12 noon.      Outpatient follow up:  Follow-up Information    Gunnar Bulla, CNM. Call in 8 week(s).   Specialties:  Certified Nurse Midwife, Obstetrics and Gynecology, Radiology Why:  Please call to schedule six (6) to eight (8) week postpartum visit with [facility] Contact information: [address] [place] [address] [phone]          Postpartum contraception: IUD  Discharged Condition: stable  Discharged to: home   Newborn Data:  Disposition:home with mother  Apgars: APGAR (1 MIN): 8   APGAR (5 MINS): 9    Baby Feeding: Breast   Gunnar Bulla, CNM [facility], [facility] 11/06/18 3:05 PM

## 2018-11-06 NOTE — Lactation Note (Signed)
This note was copied from a baby's chart. Lactation Consultation Note  Patient Name: Amber Herrera DVVOH'Y Date: 11/06/2018   Lactation rounds, mom sleeping but dad stated baby is nursing well, this is the 4th baby for mom and dad.  Maternal Data    Feeding Feeding Type: Breast Fed  LATCH Score                   Interventions    Lactation Tools Discussed/Used     Consult Status      Dyann Kief 11/06/2018, 10:20 AM

## 2018-11-06 NOTE — Discharge Instructions (Signed)
Levonorgestrel intrauterine device (IUD) °What is this medicine? °LEVONORGESTREL IUD (LEE voe nor jes trel) is a contraceptive (birth control) device. The device is placed inside the uterus by a healthcare professional. It is used to prevent pregnancy. This device can also be used to treat heavy bleeding that occurs during your period. °This medicine may be used for other purposes; ask your health care provider or pharmacist if you have questions. °COMMON BRAND NAME(S): Kyleena, LILETTA, Mirena, Skyla °What should I tell my health care provider before I take this medicine? °They need to know if you have any of these conditions: °-abnormal Pap smear °-cancer of the breast, uterus, or cervix °-diabetes °-endometritis °-genital or pelvic infection now or in the past °-have more than one sexual partner or your partner has more than one partner °-heart disease °-history of an ectopic or tubal pregnancy °-immune system problems °-IUD in place °-liver disease or tumor °-problems with blood clots or take blood-thinners °-seizures °-use intravenous drugs °-uterus of unusual shape °-vaginal bleeding that has not been explained °-an unusual or allergic reaction to levonorgestrel, other hormones, silicone, or polyethylene, medicines, foods, dyes, or preservatives °-pregnant or trying to get pregnant °-breast-feeding °How should I use this medicine? °This device is placed inside the uterus by a health care professional. °Talk to your pediatrician regarding the use of this medicine in children. Special care may be needed. °Overdosage: If you think you have taken too much of this medicine contact a poison control center or emergency room at once. °NOTE: This medicine is only for you. Do not share this medicine with others. °What if I miss a dose? °This does not apply. Depending on the brand of device you have inserted, the device will need to be replaced every 3 to 5 years if you wish to continue using this type of birth  control. °What may interact with this medicine? °Do not take this medicine with any of the following medications: °-amprenavir °-bosentan °-fosamprenavir °This medicine may also interact with the following medications: °-aprepitant °-armodafinil °-barbiturate medicines for inducing sleep or treating seizures °-bexarotene °-boceprevir °-griseofulvin °-medicines to treat seizures like carbamazepine, ethotoin, felbamate, oxcarbazepine, phenytoin, topiramate °-modafinil °-pioglitazone °-rifabutin °-rifampin °-rifapentine °-some medicines to treat HIV infection like atazanavir, efavirenz, indinavir, lopinavir, nelfinavir, tipranavir, ritonavir °-St. John's wort °-warfarin °This list may not describe all possible interactions. Give your health care provider a list of all the medicines, herbs, non-prescription drugs, or dietary supplements you use. Also tell them if you smoke, drink alcohol, or use illegal drugs. Some items may interact with your medicine. °What should I watch for while using this medicine? °Visit your doctor or health care professional for regular check ups. See your doctor if you or your partner has sexual contact with others, becomes HIV positive, or gets a sexual transmitted disease. °This product does not protect you against HIV infection (AIDS) or other sexually transmitted diseases. °You can check the placement of the IUD yourself by reaching up to the top of your vagina with clean fingers to feel the threads. Do not pull on the threads. It is a good habit to check placement after each menstrual period. Call your doctor right away if you feel more of the IUD than just the threads or if you cannot feel the threads at all. °The IUD may come out by itself. You may become pregnant if the device comes out. If you notice that the IUD has come out use a backup birth control method like condoms and call your   health care provider. Using tampons will not change the position of the IUD and are okay to use  during your period. This IUD can be safely scanned with magnetic resonance imaging (MRI) only under specific conditions. Before you have an MRI, tell your healthcare provider that you have an IUD in place, and which type of IUD you have in place. What side effects may I notice from receiving this medicine? Side effects that you should report to your doctor or health care professional as soon as possible: -allergic reactions like skin rash, itching or hives, swelling of the face, lips, or tongue -fever, flu-like symptoms -genital sores -high blood pressure -no menstrual period for 6 weeks during use -pain, swelling, warmth in the leg -pelvic pain or tenderness -severe or sudden headache -signs of pregnancy -stomach cramping -sudden shortness of breath -trouble with balance, talking, or walking -unusual vaginal bleeding, discharge -yellowing of the eyes or skin Side effects that usually do not require medical attention (report to your doctor or health care professional if they continue or are bothersome): -acne -breast pain -change in sex drive or performance -changes in weight -cramping, dizziness, or faintness while the device is being inserted -headache -irregular menstrual bleeding within first 3 to 6 months of use -nausea This list may not describe all possible side effects. Call your doctor for medical advice about side effects. You may report side effects to FDA at 1-800-FDA-1088. Where should I keep my medicine? This does not apply. NOTE: This sheet is a summary. It may not cover all possible information. If you have questions about this medicine, talk to your doctor, pharmacist, or health care provider.  2019 Elsevier/[employer] (2016-04-30 14:14:56) Breastfeeding  Choosing to breastfeed is one of the best decisions you can make for yourself and your baby. A change in hormones during pregnancy causes your breasts to make breast milk in your milk-producing glands. Hormones  prevent breast milk from being released before your baby is born. They also prompt milk flow after birth. Once breastfeeding has begun, thoughts of your baby, as well as his or her sucking or crying, can stimulate the release of milk from your milk-producing glands. Benefits of breastfeeding Research shows that breastfeeding offers many health benefits for infants and mothers. It also offers a cost-free and convenient way to feed your baby. For your baby  Your first milk (colostrum) helps your baby's digestive system to function better.  Special cells in your milk (antibodies) help your baby to fight off infections.  Breastfed babies are less likely to develop asthma, allergies, obesity, or type 2 diabetes. They are also at lower risk for sudden infant death syndrome (SIDS).  Nutrients in breast milk are better able to meet your babys needs compared to infant formula.  Breast milk improves your baby's brain development. For you  Breastfeeding helps to create a very special bond between you and your baby.  Breastfeeding is convenient. Breast milk costs nothing and is always available at the correct temperature.  Breastfeeding helps to burn calories. It helps you to lose the weight that you gained during pregnancy.  Breastfeeding makes your uterus return faster to its size before pregnancy. It also slows bleeding (lochia) after you give birth.  Breastfeeding helps to lower your risk of developing type 2 diabetes, osteoporosis, rheumatoid arthritis, cardiovascular disease, and breast, ovarian, uterine, and endometrial cancer later in life. Breastfeeding basics Starting breastfeeding  Find a comfortable place to sit or lie down, with your neck and back well-supported.  Place a pillow or a rolled-up blanket under your baby to bring him or her to the level of your breast (if you are seated). Nursing pillows are specially designed to help support your arms and your baby while you  breastfeed.  Make sure that your baby's tummy (abdomen) is facing your abdomen.  Gently massage your breast. With your fingertips, massage from the outer edges of your breast inward toward the nipple. This encourages milk flow. If your milk flows slowly, you may need to continue this action during the feeding.  Support your breast with 4 fingers underneath and your thumb above your nipple (make the letter "C" with your hand). Make sure your fingers are well away from your nipple and your babys mouth.  Stroke your baby's lips gently with your finger or nipple.  When your baby's mouth is open wide enough, quickly bring your baby to your breast, placing your entire nipple and as much of the areola as possible into your baby's mouth. The areola is the colored area around your nipple. ? More areola should be visible above your baby's upper lip than below the lower lip. ? Your baby's lips should be opened and extended outward (flanged) to ensure an adequate, comfortable latch. ? Your baby's tongue should be between his or her lower gum and your breast.  Make sure that your baby's mouth is correctly positioned around your nipple (latched). Your baby's lips should create a seal on your breast and be turned out (everted).  It is common for your baby to suck about 2-3 minutes in order to start the flow of breast milk. Latching Teaching your baby how to latch onto your breast properly is very important. An improper latch can cause nipple pain, decreased milk supply, and poor weight gain in your baby. Also, if your baby is not latched onto your nipple properly, he or she may swallow some air during feeding. This can make your baby fussy. Burping your baby when you switch breasts during the feeding can help to get rid of the air. However, teaching your baby to latch on properly is still the best way to prevent fussiness from swallowing air while breastfeeding. Signs that your baby has successfully latched  onto your nipple  Silent tugging or silent sucking, without causing you pain. Infant's lips should be extended outward (flanged).  Swallowing heard between every 3-4 sucks once your milk has started to flow (after your let-down milk reflex occurs).  Muscle movement above and in front of his or her ears while sucking. Signs that your baby has not successfully latched onto your nipple  Sucking sounds or smacking sounds from your baby while breastfeeding.  Nipple pain. If you think your baby has not latched on correctly, slip your finger into the corner of your babys mouth to break the suction and place it between your baby's gums. Attempt to start breastfeeding again. Signs of successful breastfeeding Signs from your baby  Your baby will gradually decrease the number of sucks or will completely stop sucking.  Your baby will fall asleep.  Your baby's body will relax.  Your baby will retain a small amount of milk in his or her mouth.  Your baby will let go of your breast by himself or herself. Signs from you  Breasts that have increased in firmness, weight, and size 1-3 hours after feeding.  Breasts that are softer immediately after breastfeeding.  Increased milk volume, as well as a change in milk consistency and color by  the fifth day of breastfeeding.  Nipples that are not sore, cracked, or bleeding. Signs that your baby is getting enough milk  Wetting at least 1-2 diapers during the first 24 hours after birth.  Wetting at least 5-6 diapers every 24 hours for the first week after birth. The urine should be clear or pale yellow by the age of 5 days.  Wetting 6-8 diapers every 24 hours as your baby continues to grow and develop.  At least 3 stools in a 24-hour period by the age of 5 days. The stool should be soft and yellow.  At least 3 stools in a 24-hour period by the age of 7 days. The stool should be seedy and yellow.  No loss of weight greater than 10% of birth weight  during the first 3 days of life.  Average weight gain of 4-7 oz (113-198 g) per week after the age of 4 days.  Consistent daily weight gain by the age of 5 days, without weight loss after the age of 2 weeks. After a feeding, your baby may spit up a small amount of milk. This is normal. Breastfeeding frequency and duration Frequent feeding will help you make more milk and can prevent sore nipples and extremely full breasts (breast engorgement). Breastfeed when you feel the need to reduce the fullness of your breasts or when your baby shows signs of hunger. This is called "breastfeeding on demand." Signs that your baby is hungry include:  Increased alertness, activity, or restlessness.  Movement of the head from side to side.  Opening of the mouth when the corner of the mouth or cheek is stroked (rooting).  Increased sucking sounds, smacking lips, cooing, sighing, or squeaking.  Hand-to-mouth movements and sucking on fingers or hands.  Fussing or crying. Avoid introducing a pacifier to your baby in the first 4-6 weeks after your baby is born. After this time, you may choose to use a pacifier. Research has shown that pacifier use during the first year of a baby's life decreases the risk of sudden infant death syndrome (SIDS). Allow your baby to feed on each breast as long as he or she wants. When your baby unlatches or falls asleep while feeding from the first breast, offer the second breast. Because newborns are often sleepy in the first few weeks of life, you may need to awaken your baby to get him or her to feed. Breastfeeding times will vary from baby to baby. However, the following rules can serve as a guide to help you make sure that your baby is properly fed:  Newborns (babies 63 weeks of age or younger) may breastfeed every 1-3 hours.  Newborns should not go without breastfeeding for longer than 3 hours during the day or 5 hours during the night.  You should breastfeed your baby a  minimum of 8 times in a 24-hour period. Breast milk pumping     Pumping and storing breast milk allows you to make sure that your baby is exclusively fed your breast milk, even at times when you are unable to breastfeed. This is especially important if you go back to work while you are still breastfeeding, or if you are not able to be present during feedings. Your lactation consultant can help you find a method of pumping that works best for you and give you guidelines about how long it is safe to store breast milk. Caring for your breasts while you breastfeed Nipples can become dry, cracked, and sore while breastfeeding.  The following recommendations can help keep your breasts moisturized and healthy:  Avoid using soap on your nipples.  Wear a supportive bra designed especially for nursing. Avoid wearing underwire-style bras or extremely tight bras (sports bras).  Air-dry your nipples for 3-4 minutes after each feeding.  Use only cotton bra pads to absorb leaked breast milk. Leaking of breast milk between feedings is normal.  Use lanolin on your nipples after breastfeeding. Lanolin helps to maintain your skin's normal moisture barrier. Pure lanolin is not harmful (not toxic) to your baby. You may also hand express a few drops of breast milk and gently massage that milk into your nipples and allow the milk to air-dry. In the first few weeks after giving birth, some women experience breast engorgement. Engorgement can make your breasts feel heavy, warm, and tender to the touch. Engorgement peaks within 3-5 days after you give birth. The following recommendations can help to ease engorgement:  Completely empty your breasts while breastfeeding or pumping. You may want to start by applying warm, moist heat (in the shower or with warm, water-soaked hand towels) just before feeding or pumping. This increases circulation and helps the milk flow. If your baby does not completely empty your breasts while  breastfeeding, pump any extra milk after he or she is finished.  Apply ice packs to your breasts immediately after breastfeeding or pumping, unless this is too uncomfortable for you. To do this: ? Put ice in a plastic bag. ? Place a towel between your skin and the bag. ? Leave the ice on for 20 minutes, 2-3 times a day.  Make sure that your baby is latched on and positioned properly while breastfeeding. If engorgement persists after 48 hours of following these recommendations, contact your health care provider or a Advertising copywriter. Overall health care recommendations while breastfeeding  Eat 3 healthy meals and 3 snacks every day. Well-nourished mothers who are breastfeeding need an additional 450-500 calories a day. You can meet this requirement by increasing the amount of a balanced diet that you eat.  Drink enough water to keep your urine pale yellow or clear.  Rest often, relax, and continue to take your prenatal vitamins to prevent fatigue, stress, and low vitamin and mineral levels in your body (nutrient deficiencies).  Do not use any products that contain nicotine or tobacco, such as cigarettes and e-cigarettes. Your baby may be harmed by chemicals from cigarettes that pass into breast milk and exposure to secondhand smoke. If you need help quitting, ask your health care provider.  Avoid alcohol.  Do not use illegal drugs or marijuana.  Talk with your health care provider before taking any medicines. These include over-the-counter and prescription medicines as well as vitamins and herbal supplements. Some medicines that may be harmful to your baby can pass through breast milk.  It is possible to become pregnant while breastfeeding. If birth control is desired, ask your health care provider about options that will be safe while breastfeeding your baby. Where to find more information: [employer]: www.llli.org Contact a health care provider if:  You feel like  you want to stop breastfeeding or have become frustrated with breastfeeding.  Your nipples are cracked or bleeding.  Your breasts are red, tender, or warm.  You have: ? Painful breasts or nipples. ? A swollen area on either breast. ? A fever or chills. ? Nausea or vomiting. ? Drainage other than breast milk from your nipples.  Your breasts do not become full  before feedings by the fifth day after you give birth.  You feel sad and depressed.  Your baby is: ? Too sleepy to eat well. ? Having trouble sleeping. ? More than 741 week old and wetting fewer than 6 diapers in a 24-hour period. ? Not gaining weight by 565 days of age.  Your baby has fewer than 3 stools in a 24-hour period.  Your baby's skin or the white parts of his or her eyes become yellow. Get help right away if:  Your baby is overly tired (lethargic) and does not want to wake up and feed.  Your baby develops an unexplained fever. Summary  Breastfeeding offers many health benefits for infant and mothers.  Try to breastfeed your infant when he or she shows early signs of hunger.  Gently tickle or stroke your baby's lips with your finger or nipple to allow the baby to open his or her mouth. Bring the baby to your breast. Make sure that much of the areola is in your baby's mouth. Offer one side and burp the baby before you offer the other side.  Talk with your health care provider or lactation consultant if you have questions or you face problems as you breastfeed. This information is not intended to replace advice given to you by your health care provider. Make sure you discuss any questions you have with your health care provider. Document Released: 07/19/2005 Document Revised: 08/20/2016 Document Reviewed: 08/20/2016 Elsevier Interactive Patient Education  2019 [employer] Home Care Instructions for Mom Activity  Gradually return to your regular activities.  Let yourself rest. Nap while your baby  sleeps.  Avoid lifting anything that is heavier than 10 lb (4.5 kg) until your health care provider says it is okay.  Avoid activities that take a lot of effort and energy (are strenuous) until approved by your health care provider. Walking at a slow-to-moderate pace is usually safe.  If you had a cesarean delivery: ? Do not vacuum, climb stairs, or drive a car for 4-6 weeks. ? Have someone help you at home until you feel like you can do your usual activities yourself. ? Do exercises as told by your health care provider, if this applies. Vaginal bleeding You may continue to bleed for 4-6 weeks after delivery. Over time, the amount of blood usually decreases and the color of the blood usually gets lighter. However, the flow of bright red blood may increase if you have been too active. If you need to use more than one pad in an hour because your pad gets soaked, or if you pass a large clot:  Lie down.  Raise your feet.  Place a cold compress on your lower abdomen.  Rest.  Call your health care provider. If you are breastfeeding, your period should return anytime between 8 weeks after delivery and the time that you stop breastfeeding. If you are not breastfeeding, your period should return 6-8 weeks after delivery. Perineal care The perineal area, or perineum, is the part of your body between your thighs. After delivery, this area needs special care. Follow these instructions as told by your health care provider.  Take warm tub baths for 15-20 minutes.  Use medicated pads and pain-relieving sprays and creams as told.  Do not use tampons or douches until vaginal bleeding has stopped.  Each time you go to the bathroom: ? Use a peri bottle. ? Change your pad. ? Use towelettes in place of toilet paper until your stitches have healed.  Do Kegel exercises every day. Kegel exercises help to maintain the muscles that support the vagina, bladder, and bowels. You can do these exercises while  you are standing, sitting, or lying down. To do Kegel exercises: ? Tighten the muscles of your abdomen and the muscles that surround your birth canal. ? Hold for a few seconds. ? Relax. ? Repeat until you have done this 5 times in a row.  To prevent hemorrhoids from developing or getting worse: ? Drink enough fluid to keep your urine clear or pale yellow. ? Avoid straining when having a bowel movement. ? Take over-the-counter medicines and stool softeners as told by your health care provider. Breast care  Wear a tight-fitting bra.  Avoid taking over-the-counter pain medicine for breast discomfort.  Apply ice to the breasts to help with discomfort as needed: ? Put ice in a plastic bag. ? Place a towel between your skin and the bag. ? Leave the ice on for 20 minutes or as told by your health care provider. Nutrition  Eat a well-balanced diet.  Do not try to lose weight quickly by cutting back on calories.  Take your prenatal vitamins until your postpartum checkup or until your health care provider tells you to stop. Postpartum depression You may find yourself crying for no apparent reason and unable to cope with all of the changes that come with having a newborn. This mood is called postpartum depression. Postpartum depression happens because your hormone levels change after delivery. If you have postpartum depression, get support from your partner, friends, and family. If the depression does not go away on its own after several weeks, contact your health care provider. Breast self-exam  Do a breast self-exam each month, at the same time of the month. If you are breastfeeding, check your breasts just after a feeding, when your breasts are less full. If you are breastfeeding and your period has started, check your breasts on day 5, 6, or 7 of your period. Report any lumps, bumps, or discharge to your health care provider. Know that breasts are normally lumpy if you are breastfeeding.  This is temporary, and it is not a health risk. Intimacy and sexuality Avoid sexual activity for at least 3-4 weeks after delivery or until the brownish-red vaginal flow is completely gone. If you want to avoid pregnancy, use some form of birth control. You can get pregnant after delivery, even if you have not had your period. Contact a health care provider if:  You feel unable to cope with the changes that a child brings to your life, and these feelings do not go away after several weeks.  You notice a lump, a bump, or discharge on your breast. Get help right away if:  Blood soaks your pad in 1 hour or less.  You have: ? Severe pain or cramping in your lower abdomen. ? A bad-smelling vaginal discharge. ? A fever that is not controlled by medicine. ? A fever, and an area of your breast is red and sore. ? Pain or redness in your calf. ? Sudden, severe chest pain. ? Shortness of breath. ? Painful or bloody urination. ? Problems with your vision. ? You vomit for 12 hours or longer. ? You develop a severe headache. ? You have serious thoughts about hurting yourself, your child, or anyone else. This information is not intended to replace advice given to you by your health care provider. Make sure you discuss any questions you have with your health care  provider. Document Released: 07/16/2000 Document Revised: 09/14/2017 Document Reviewed: 01/20/2015 Elsevier Interactive Patient Education  2019 [employer] Postpartum Care After Vaginal Delivery This sheet gives you information about how to care for yourself from the time you deliver your baby to up to 6-12 weeks after delivery (postpartum period). Your health care provider may also give you more specific instructions. If you have problems or questions, contact your health care provider. Follow these instructions at home: Vaginal bleeding  It is normal to have vaginal bleeding (lochia) after delivery. Wear a sanitary pad for vaginal  bleeding and discharge. ? During the first week after delivery, the amount and appearance of lochia is often similar to a menstrual period. ? Over the next few weeks, it will gradually decrease to a dry, yellow-brown discharge. ? For most women, lochia stops completely by 4-6 weeks after delivery. Vaginal bleeding can vary from woman to woman.  Change your sanitary pads frequently. Watch for any changes in your flow, such as: ? A sudden increase in volume. ? A change in color. ? Large blood clots.  If you pass a blood clot from your vagina, save it and call your health care provider to discuss. Do not flush blood clots down the toilet before talking with your health care provider.  Do not use tampons or douches until your health care provider says this is safe.  If you are not breastfeeding, your period should return 6-8 weeks after delivery. If you are feeding your child breast milk only (exclusive breastfeeding), your period may not return until you stop breastfeeding. Perineal care  Keep the area between the vagina and the anus (perineum) clean and dry as told by your health care provider. Use medicated pads and pain-relieving sprays and creams as directed.  If you had a cut in the perineum (episiotomy) or a tear in the vagina, check the area for signs of infection until you are healed. Check for: ? More redness, swelling, or pain. ? Fluid or blood coming from the cut or tear. ? Warmth. ? Pus or a bad smell.  You may be given a squirt bottle to use instead of wiping to clean the perineum area after you go to the bathroom. As you start healing, you may use the squirt bottle before wiping yourself. Make sure to wipe gently.  To relieve pain caused by an episiotomy, a tear in the vagina, or swollen veins in the anus (hemorrhoids), try taking a warm sitz bath 2-3 times a day. A sitz bath is a warm water bath that is taken while you are sitting down. The water should only come up to your  hips and should cover your buttocks. Breast care  Within the first few days after delivery, your breasts may feel heavy, full, and uncomfortable (breast engorgement). Milk may also leak from your breasts. Your health care provider can suggest ways to help relieve the discomfort. Breast engorgement should go away within a few days.  If you are breastfeeding: ? Wear a bra that supports your breasts and fits you well. ? Keep your nipples clean and dry. Apply creams and ointments as told by your health care provider. ? You may need to use breast pads to absorb milk that leaks from your breasts. ? You may have uterine contractions every time you breastfeed for up to several weeks after delivery. Uterine contractions help your uterus return to its normal size. ? If you have any problems with breastfeeding, work with your health care provider or  lactation Research scientist (medical).  If you are not breastfeeding: ? Avoid touching your breasts a lot. Doing this can make your breasts produce more milk. ? Wear a good-fitting bra and use cold packs to help with swelling. ? Do not squeeze out (express) milk. This causes you to make more milk. Intimacy and sexuality  Ask your health care provider when you can engage in sexual activity. This may depend on: ? Your risk of infection. ? How fast you are healing. ? Your comfort and desire to engage in sexual activity.  You are able to get pregnant after delivery, even if you have not had your period. If desired, talk with your health care provider about methods of birth control (contraception). Medicines  Take over-the-counter and prescription medicines only as told by your health care provider.  If you were prescribed an antibiotic medicine, take it as told by your health care provider. Do not stop taking the antibiotic even if you start to feel better. Activity  Gradually return to your normal activities as told by your health care provider. Ask your health care  provider what activities are safe for you.  Rest as much as possible. Try to rest or take a nap while your baby is sleeping. Eating and drinking   Drink enough fluid to keep your urine pale yellow.  Eat high-fiber foods every day. These may help prevent or relieve constipation. High-fiber foods include: ? Whole grain cereals and breads. ? Brown rice. ? Beans. ? Fresh fruits and vegetables.  Do not try to lose weight quickly by cutting back on calories.  Take your prenatal vitamins until your postpartum checkup or until your health care provider tells you it is okay to stop. Lifestyle  Do not use any products that contain nicotine or tobacco, such as cigarettes and e-cigarettes. If you need help quitting, ask your health care provider.  Do not drink alcohol, especially if you are breastfeeding. General instructions  Keep all follow-up visits for you and your baby as told by your health care provider. Most women visit their health care provider for a postpartum checkup within the first 3-6 weeks after delivery. Contact a health care provider if:  You feel unable to cope with the changes that your child brings to your life, and these feelings do not go away.  You feel unusually sad or worried.  Your breasts become red, painful, or hard.  You have a fever.  You have trouble holding urine or keeping urine from leaking.  You have little or no interest in activities you used to enjoy.  You have not breastfed at all and you have not had a menstrual period for 12 weeks after delivery.  You have stopped breastfeeding and you have not had a menstrual period for 12 weeks after you stopped breastfeeding.  You have questions about caring for yourself or your baby.  You pass a blood clot from your vagina. Get help right away if:  You have chest pain.  You have difficulty breathing.  You have sudden, severe leg pain.  You have severe pain or cramping in your lower  abdomen.  You bleed from your vagina so much that you fill more than one sanitary pad in one hour. Bleeding should not be heavier than your heaviest period.  You develop a severe headache.  You faint.  You have blurred vision or spots in your vision.  You have bad-smelling vaginal discharge.  You have thoughts about hurting yourself or your baby. If you  ever feel like you may hurt yourself or others, or have thoughts about taking your own life, get help right away. You can go to the nearest emergency department or call:  Your local emergency services (911 in the [place]  A suicide crisis helpline, such as the National Suicide Prevention Lifeline at 754-116-1011. This is open 24 hours a day. Summary  The period of time right after you deliver your newborn up to 6-12 weeks after delivery is called the postpartum period.  Gradually return to your normal activities as told by your health care provider.  Keep all follow-up visits for you and your baby as told by your health care provider. This information is not intended to replace advice given to you by your health care provider. Make sure you discuss any questions you have with your health care provider. Document Released: 05/16/2007 Document Revised: 05/02/2017 Document Reviewed: 05/02/2017 Elsevier Interactive Patient Education  2019 [employer]. Postpartum Baby Blues The postpartum period begins right after the birth of a baby. During this time, there is often a lot of joy and excitement. It is also a time of many changes in the life of the parents. No matter how many times a mother gives birth, each child brings new challenges to the family, including different ways of relating to one another. It is common to have feelings of excitement along with confusing changes in moods, emotions, and thoughts. You may feel happy one minute and sad or stressed the next. These feelings of sadness usually happen in the period right after you have  your baby, and they go away within a week or two. This is called the "baby blues." What are the causes? There is no known cause of baby blues. It is likely caused by a combination of factors. However, changes in hormone levels after childbirth are believed to trigger some of the symptoms. Other factors that can play a role in these mood changes include:  Lack of sleep.  Stressful life events, such as poverty, caring for a loved one, or death of a loved one.  Genetics. What are the signs or symptoms? Symptoms of this condition include:  Brief changes in mood, such as going from extreme happiness to sadness.  Decreased concentration.  Difficulty sleeping.  Crying spells and tearfulness.  Loss of appetite.  Irritability.  Anxiety. If the symptoms of baby blues last for more than 2 weeks or become more severe, you may have postpartum depression. How is this diagnosed? This condition is diagnosed based on an evaluation of your symptoms. There are no medical or lab tests that lead to a diagnosis, but there are various questionnaires that a health care provider may use to identify women with the baby blues or postpartum depression. How is this treated? Treatment is not needed for this condition. The baby blues usually go away on their own in 1-2 weeks. Social support is often all that is needed. You will be encouraged to get adequate sleep and rest. Follow these instructions at home: Lifestyle      Get as much rest as you can. Take a nap when the baby sleeps.  Exercise regularly as told by your health care provider. Some women find yoga and walking to be helpful.  Eat a balanced and nourishing diet. This includes plenty of fruits and vegetables, whole grains, and lean proteins.  Do little things that you enjoy. Have a cup of tea, take a bubble bath, read your favorite magazine, or listen to your favorite  music.  Avoid alcohol.  Ask for help with household chores, cooking,  grocery shopping, or running errands. Do not try to do everything yourself. Consider hiring a postpartum doula to help. This is a professional who specializes in providing support to new mothers.  Try not to make any major life changes during pregnancy or right after giving birth. This can add stress. General instructions  Talk to people close to you about how you are feeling. Get support from your partner, family members, friends, or other new moms. You may want to join a support group.  Find ways to cope with stress. This may include: ? Writing your thoughts and feelings in a journal. ? Spending time outside. ? Spending time with people who make you laugh.  Try to stay positive in how you think. Think about the things you are grateful for.  Take over-the-counter and prescription medicines only as told by your health care provider.  Let your health care provider know if you have any concerns.  Keep all postpartum visits as told by your health care provider. This is important. Contact a health care provider if:  Your baby blues do not go away after 2 weeks. Get help right away if:  You have thoughts of taking your own life (suicidal thoughts).  You think you may harm the baby or other people.  You see or hear things that are not there (hallucinations). Summary  After giving birth, you may feel happy one minute and sad or stressed the next. Feelings of sadness that happen right after the baby is born and go away after a week or two are called the "baby blues."  You can manage the baby blues by getting enough rest, eating a healthy diet, exercising, spending time with supportive people, and finding ways to cope with stress.  If feelings of sadness and stress last longer than 2 weeks or get in the way of caring for your baby, talk to your health care provider. This may mean you have postpartum depression. This information is not intended to replace advice given to you by your health  care provider. Make sure you discuss any questions you have with your health care provider. Document Released: 04/22/2004 Document Revised: 09/14/2016 Document Reviewed: 09/14/2016 Elsevier Interactive Patient Education  2019 [employer].

## 2018-11-09 ENCOUNTER — Encounter: Payer: BLUE CROSS/BLUE SHIELD | Admitting: Physical Therapy

## 2018-11-16 ENCOUNTER — Encounter: Payer: BLUE CROSS/BLUE SHIELD | Admitting: Physical Therapy

## 2018-11-23 ENCOUNTER — Encounter: Payer: BLUE CROSS/BLUE SHIELD | Admitting: Physical Therapy

## 2018-11-30 ENCOUNTER — Encounter: Payer: BLUE CROSS/BLUE SHIELD | Admitting: Physical Therapy

## 2018-12-21 ENCOUNTER — Telehealth: Payer: Self-pay

## 2018-12-21 NOTE — Telephone Encounter (Signed)
Coronavirus (COVID-19) Are you at risk?  Are you at risk for the Coronavirus (COVID-19)?  To be considered HIGH RISK for Coronavirus (COVID-19), you have to meet the following criteria:  . Traveled to [place], [place], [place], [place] or [place]; or in the [place] to [place], [place], [place], or [place]; and have fever, cough, and shortness of breath within the last 2 weeks of travel OR . Been in close contact with a person diagnosed with COVID-19 within the last 2 weeks and have fever, cough, and shortness of breath . IF YOU DO NOT MEET THESE CRITERIA, YOU ARE CONSIDERED LOW RISK FOR COVID-19.  What to do if you are HIGH RISK for COVID-19?  . If you are having a medical emergency, call 911. . Seek medical care right away. Before you go to a doctor's office, urgent care or emergency department, call ahead and tell them about your recent travel, contact with someone diagnosed with COVID-19, and your symptoms. You should receive instructions from your physician's office regarding next steps of care.  . When you arrive at healthcare provider, tell the healthcare staff immediately you have returned from visiting [place], [place], [place], [place] or [place]; or traveled in the [place] to [place], [place], [place], or [place]; in the last two weeks or you have been in close contact with a person diagnosed with COVID-19 in the last 2 weeks.   . Tell the health care staff about your symptoms: fever, cough and shortness of breath. . After you have been seen by a medical provider, you will be either: o Tested for (COVID-19) and discharged home on quarantine except to seek medical care if symptoms worsen, and asked to  - Stay home and avoid contact with others until you get your results (4-5 days)  - Avoid travel on public transportation if possible (such as bus, train, or airplane) or o Sent to the Emergency Department by EMS for evaluation, COVID-19 testing, and possible  admission depending on your condition and test results.  What to do if you are LOW RISK for COVID-19?  Reduce your risk of any infection by using the same precautions used for avoiding the common cold or flu:  . Wash your hands often with soap and warm water for at least 20 seconds.  If soap and water are not readily available, use an alcohol-based hand sanitizer with at least 60% alcohol.  . If coughing or sneezing, cover your mouth and nose by coughing or sneezing into the elbow areas of your shirt or coat, into a tissue or into your sleeve (not your hands). . Avoid shaking hands with others and consider head nods or verbal greetings only. . Avoid touching your eyes, nose, or mouth with unwashed hands.  . Avoid close contact with people who are sick. . Avoid places or events with large numbers of people in one location, like concerts or sporting events. . Carefully consider travel plans you have or are making. . If you are planning any travel outside or inside the [place], visit the CDC's Travelers' Health webpage for the latest health notices. . If you have some symptoms but not all symptoms, continue to monitor at home and seek medical attention if your symptoms worsen. . If you are having a medical emergency, call 911.   ADDITIONAL HEALTHCARE OPTIONS FOR PATIENTS  [facility] / e-Visit: [email or web address]         [facility]: [phone]  [facility]: [phone]                   [facility]: [phone]   Pre-screen negative, DM.   

## 2018-12-22 ENCOUNTER — Other Ambulatory Visit: Payer: Self-pay

## 2018-12-22 ENCOUNTER — Ambulatory Visit (INDEPENDENT_AMBULATORY_CARE_PROVIDER_SITE_OTHER): Payer: BLUE CROSS/BLUE SHIELD | Admitting: Certified Nurse Midwife

## 2018-12-22 NOTE — Progress Notes (Signed)
Subjective:    Sharea Boggs is a 28 y.o. [id number] female who presents for a postpartum visit. She is 6 weeks postpartum following a spontaneous vaginal delivery at 38+6 gestational weeks. Anesthesia: none. I have fully reviewed the prenatal and intrapartum course.   Postpartum course has been uncomplicated. Baby's course has been uncomplicated. Baby is feeding by breast. Bleeding no bleeding. Bowel function is normal. Bladder function is normal.   Patient is sexually active. Last sexual activity: yesterday. Contraception method is coitus interruptus. Postpartum depression screening: negative. Score 0.  Last pap 03/2017 and was normal per patient.  Denies difficulty breathing or respiratory distress, chest pain, abdominal pain, vaginal bleeding, dysuria, and leg pain or swelling.   The following portions of the patient's history were reviewed and updated as appropriate: allergies, current medications, past medical history, past surgical history and problem list.  Review of Systems  Pertinent items are noted in HPI.   Objective:   BP 95/62   Pulse 73   Ht 5\' 6"  (1.676 m)   Wt 127 lb 6 oz (57.8 kg)   LMP 02/13/2018 (Exact Date)   Breastfeeding Yes   BMI 20.56 kg/m   General:  alert, cooperative and no distress   Breasts:  deferred, no complaints  Lungs: clear to auscultation bilaterally  Heart:  regular rate and rhythm  Abdomen: soft, nontender   Vulva: normal  Vagina: normal vagina  Cervix:  closed  Corpus: Well-involuted  Adnexa:  Non-palpable     Office Visit from 12/22/2018 in [facility]  PHQ-2 Total Score  0          Assessment:   Postpartum exam Six (6) wks s/p spontaneous vaginal birth Breastfeeding Depression screening Contraception counseling   Plan:   Encouraged routine health maintenance techniques.   Advised no intercourse x 2 weeks.   Follow up in: 2 weeks for Mirena insertion or earlier if needed.   Gunnar Bulla,  CNM [facility], [facility] 12/22/18 11:05 AM

## 2018-12-22 NOTE — Patient Instructions (Signed)
Levonorgestrel intrauterine device (IUD) °What is this medicine? °LEVONORGESTREL IUD (LEE voe nor jes trel) is a contraceptive (birth control) device. The device is placed inside the uterus by a healthcare professional. It is used to prevent pregnancy. This device can also be used to treat heavy bleeding that occurs during your period. °This medicine may be used for other purposes; ask your health care provider or pharmacist if you have questions. °COMMON BRAND NAME(S): Kyleena, LILETTA, Mirena, Skyla °What should I tell my health care provider before I take this medicine? °They need to know if you have any of these conditions: °-abnormal Pap smear °-cancer of the breast, uterus, or cervix °-diabetes °-endometritis °-genital or pelvic infection now or in the past °-have more than one sexual partner or your partner has more than one partner °-heart disease °-history of an ectopic or tubal pregnancy °-immune system problems °-IUD in place °-liver disease or tumor °-problems with blood clots or take blood-thinners °-seizures °-use intravenous drugs °-uterus of unusual shape °-vaginal bleeding that has not been explained °-an unusual or allergic reaction to levonorgestrel, other hormones, silicone, or polyethylene, medicines, foods, dyes, or preservatives °-pregnant or trying to get pregnant °-breast-feeding °How should I use this medicine? °This device is placed inside the uterus by a health care professional. °Talk to your pediatrician regarding the use of this medicine in children. Special care may be needed. °Overdosage: If you think you have taken too much of this medicine contact a poison control center or emergency room at once. °NOTE: This medicine is only for you. Do not share this medicine with others. °What if I miss a dose? °This does not apply. Depending on the brand of device you have inserted, the device will need to be replaced every 3 to 5 years if you wish to continue using this type of birth  control. °What may interact with this medicine? °Do not take this medicine with any of the following medications: °-amprenavir °-bosentan °-fosamprenavir °This medicine may also interact with the following medications: °-aprepitant °-armodafinil °-barbiturate medicines for inducing sleep or treating seizures °-bexarotene °-boceprevir °-griseofulvin °-medicines to treat seizures like carbamazepine, ethotoin, felbamate, oxcarbazepine, phenytoin, topiramate °-modafinil °-pioglitazone °-rifabutin °-rifampin °-rifapentine °-some medicines to treat HIV infection like atazanavir, efavirenz, indinavir, lopinavir, nelfinavir, tipranavir, ritonavir °-St. John's wort °-warfarin °This list may not describe all possible interactions. Give your health care provider a list of all the medicines, herbs, non-prescription drugs, or dietary supplements you use. Also tell them if you smoke, drink alcohol, or use illegal drugs. Some items may interact with your medicine. °What should I watch for while using this medicine? °Visit your doctor or health care professional for regular check ups. See your doctor if you or your partner has sexual contact with others, becomes HIV positive, or gets a sexual transmitted disease. °This product does not protect you against HIV infection (AIDS) or other sexually transmitted diseases. °You can check the placement of the IUD yourself by reaching up to the top of your vagina with clean fingers to feel the threads. Do not pull on the threads. It is a good habit to check placement after each menstrual period. Call your doctor right away if you feel more of the IUD than just the threads or if you cannot feel the threads at all. °The IUD may come out by itself. You may become pregnant if the device comes out. If you notice that the IUD has come out use a backup birth control method like condoms and call your   health care provider. °Using tampons will not change the position of the IUD and are okay to use  during your period. °This IUD can be safely scanned with magnetic resonance imaging (MRI) only under specific conditions. Before you have an MRI, tell your healthcare provider that you have an IUD in place, and which type of IUD you have in place. °What side effects may I notice from receiving this medicine? °Side effects that you should report to your doctor or health care professional as soon as possible: °-allergic reactions like skin rash, itching or hives, swelling of the face, lips, or tongue °-fever, flu-like symptoms °-genital sores °-high blood pressure °-no menstrual period for 6 weeks during use °-pain, swelling, warmth in the leg °-pelvic pain or tenderness °-severe or sudden headache °-signs of pregnancy °-stomach cramping °-sudden shortness of breath °-trouble with balance, talking, or walking °-unusual vaginal bleeding, discharge °-yellowing of the eyes or skin °Side effects that usually do not require medical attention (report to your doctor or health care professional if they continue or are bothersome): °-acne °-breast pain °-change in sex drive or performance °-changes in weight °-cramping, dizziness, or faintness while the device is being inserted °-headache °-irregular menstrual bleeding within first 3 to 6 months of use °-nausea °This list may not describe all possible side effects. Call your doctor for medical advice about side effects. You may report side effects to FDA at 1-800-FDA-1088. °Where should I keep my medicine? °This does not apply. °NOTE: This sheet is a summary. It may not cover all possible information. If you have questions about this medicine, talk to your doctor, pharmacist, or health care provider. °© 2019 Elsevier/Gold Standard (2016-04-30 14:14:56) °Preventive Care 18-39 Years, Female °Preventive care refers to lifestyle choices and visits with your health care provider that can promote health and wellness. °What does preventive care include? ° °· A yearly physical exam.  This is also called an annual well check. °· Dental exams once or twice a year. °· Routine eye exams. Ask your health care provider how often you should have your eyes checked. °· Personal lifestyle choices, including: °? Daily care of your teeth and gums. °? Regular physical activity. °? Eating a healthy diet. °? Avoiding tobacco and drug use. °? Limiting alcohol use. °? Practicing safe sex. °? Taking vitamin and mineral supplements as recommended by your health care provider. °What happens during an annual well check? °The services and screenings done by your health care provider during your annual well check will depend on your age, overall health, lifestyle risk factors, and family history of disease. °Counseling °Your health care provider may ask you questions about your: °· Alcohol use. °· Tobacco use. °· Drug use. °· Emotional well-being. °· Home and relationship well-being. °· Sexual activity. °· Eating habits. °· Work and work environment. °· Method of birth control. °· Menstrual cycle. °· Pregnancy history. °Screening °You may have the following tests or measurements: °· Height, weight, and BMI. °· Diabetes screening. This is done by checking your blood sugar (glucose) after you have not eaten for a while (fasting). °· Blood pressure. °· Lipid and cholesterol levels. These may be checked every 5 years starting at age 20. °· Skin check. °· Hepatitis C blood test. °· Hepatitis B blood test. °· Sexually transmitted disease (STD) testing. °· BRCA-related cancer screening. This may be done if you have a family history of breast, ovarian, tubal, or peritoneal cancers. °· Pelvic exam and Pap test. This may be done every 3   years starting at age 21. Starting at age 30, this may be done every 5 years if you have a Pap test in combination with an HPV test. °Discuss your test results, treatment options, and if necessary, the need for more tests with your health care provider. °Vaccines °Your health care provider may  recommend certain vaccines, such as: °· Influenza vaccine. This is recommended every year. °· Tetanus, diphtheria, and acellular pertussis (Tdap, Td) vaccine. You may need a Td booster every 10 years. °· Varicella vaccine. You may need this if you have not been vaccinated. °· HPV vaccine. If you are 26 or younger, you may need three doses over 6 months. °· Measles, mumps, and rubella (MMR) vaccine. You may need at least one dose of MMR. You may also need a second dose. °· Pneumococcal 13-valent conjugate (PCV13) vaccine. You may need this if you have certain conditions and were not previously vaccinated. °· Pneumococcal polysaccharide (PPSV23) vaccine. You may need one or two doses if you smoke cigarettes or if you have certain conditions. °· Meningococcal vaccine. One dose is recommended if you are age 19-21 years and a first-year college student living in a residence hall, or if you have one of several medical conditions. You may also need additional booster doses. °· Hepatitis A vaccine. You may need this if you have certain conditions or if you travel or work in places where you may be exposed to hepatitis A. °· Hepatitis B vaccine. You may need this if you have certain conditions or if you travel or work in places where you may be exposed to hepatitis B. °· Haemophilus influenzae type b (Hib) vaccine. You may need this if you have certain risk factors. °Talk to your health care provider about which screenings and vaccines you need and how often you need them. °This information is not intended to replace advice given to you by your health care provider. Make sure you discuss any questions you have with your health care provider. °Document Released: 09/14/2001 Document Revised: 03/01/2017 Document Reviewed: 05/20/2015 °Elsevier Interactive Patient Education © 2019 Elsevier Inc. ° °

## 2018-12-23 ENCOUNTER — Encounter: Payer: Self-pay | Admitting: Certified Nurse Midwife

## 2019-01-05 ENCOUNTER — Telehealth: Payer: Self-pay

## 2019-01-05 NOTE — Telephone Encounter (Signed)
Coronavirus (COVID-19) Are you at risk?  Are you at risk for the Coronavirus (COVID-19)?  To be considered HIGH RISK for Coronavirus (COVID-19), you have to meet the following criteria:  . Traveled to [place], [place], [place], [place] or [place]; or in the [place] to [place], [place], [place], or [place]; and have fever, cough, and shortness of breath within the last 2 weeks of travel OR . Been in close contact with a person diagnosed with COVID-19 within the last 2 weeks and have fever, cough, and shortness of breath . IF YOU DO NOT MEET THESE CRITERIA, YOU ARE CONSIDERED LOW RISK FOR COVID-19.  What to do if you are HIGH RISK for COVID-19?  . If you are having a medical emergency, call 911. . Seek medical care right away. Before you go to a doctor's office, urgent care or emergency department, call ahead and tell them about your recent travel, contact with someone diagnosed with COVID-19, and your symptoms. You should receive instructions from your physician's office regarding next steps of care.  . When you arrive at healthcare provider, tell the healthcare staff immediately you have returned from visiting [place], [place], [place], [place] or [place]; or traveled in the [place] to [place], [place], [place], or [place]; in the last two weeks or you have been in close contact with a person diagnosed with COVID-19 in the last 2 weeks.   . Tell the health care staff about your symptoms: fever, cough and shortness of breath. . After you have been seen by a medical provider, you will be either: o Tested for (COVID-19) and discharged home on quarantine except to seek medical care if symptoms worsen, and asked to  - Stay home and avoid contact with others until you get your results (4-5 days)  - Avoid travel on public transportation if possible (such as bus, train, or airplane) or o Sent to the Emergency Department by EMS for evaluation, COVID-19 testing, and possible  admission depending on your condition and test results.  What to do if you are LOW RISK for COVID-19?  Reduce your risk of any infection by using the same precautions used for avoiding the common cold or flu:  . Wash your hands often with soap and warm water for at least 20 seconds.  If soap and water are not readily available, use an alcohol-based hand sanitizer with at least 60% alcohol.  . If coughing or sneezing, cover your mouth and nose by coughing or sneezing into the elbow areas of your shirt or coat, into a tissue or into your sleeve (not your hands). . Avoid shaking hands with others and consider head nods or verbal greetings only. . Avoid touching your eyes, nose, or mouth with unwashed hands.  . Avoid close contact with people who are Amber Herrera. . Avoid places or events with large numbers of people in one location, like concerts or sporting events. . Carefully consider travel plans you have or are making. . If you are planning any travel outside or inside the [place], visit the CDC's Travelers' Health webpage for the latest health notices. . If you have some symptoms but not all symptoms, continue to monitor at home and seek medical attention if your symptoms worsen. . If you are having a medical emergency, call 911.  01/05/19 SCREENING NEG SLS ADDITIONAL HEALTHCARE OPTIONS FOR PATIENTS  [facility] / e-Visit: [email or web address]         [facility]: [phone]    [facility]: [phone]                   [facility]: [phone]  

## 2019-01-08 ENCOUNTER — Other Ambulatory Visit: Payer: Self-pay

## 2019-01-08 ENCOUNTER — Ambulatory Visit (INDEPENDENT_AMBULATORY_CARE_PROVIDER_SITE_OTHER): Payer: BC Managed Care – PPO | Admitting: Certified Nurse Midwife

## 2019-01-08 ENCOUNTER — Encounter: Payer: Self-pay | Admitting: Certified Nurse Midwife

## 2019-01-08 VITALS — BP 104/61 | HR 73 | Ht 66.0 in | Wt 127.8 lb

## 2019-01-08 DIAGNOSIS — Z30430 Encounter for insertion of intrauterine contraceptive device: Secondary | ICD-10-CM | POA: Diagnosis not present

## 2019-01-08 DIAGNOSIS — Z3202 Encounter for pregnancy test, result negative: Secondary | ICD-10-CM

## 2019-01-08 LAB — POCT URINE PREGNANCY: Preg Test, Ur: NEGATIVE

## 2019-01-08 NOTE — Progress Notes (Signed)
Patient comes in today for IUD insertion. Patient states that she has not had intercourse since delivery. She has no had regular cycle.

## 2019-01-08 NOTE — Patient Instructions (Signed)
IUD PLACEMENT POST-PROCEDURE INSTRUCTIONS  1. You may take Ibuprofen, Aleve or Tylenol for pain if needed.  Cramping should resolve within in 24 hours.  2. You may have a small amount of spotting.  You should wear a mini pad for the next few days.  3. You may have intercourse after 24 hours.  If you using this for birth control, it is effective immediately.  4. You need to call if you have any pelvic pain, fever, heavy bleeding or foul smelling vaginal discharge.  Irregular bleeding is common the first several months after having an IUD placed. You do not need to call for this reason unless you are concerned.  5. Shower or bathe as normal  6. You should have a follow-up appointment in 4-8 weeks for a re-check to make sure you are not having any problems.  Levonorgestrel intrauterine device (IUD) What is this medicine? LEVONORGESTREL IUD (LEE voe nor jes trel) is a contraceptive (birth control) device. The device is placed inside the uterus by a healthcare professional. It is used to prevent pregnancy. This device can also be used to treat heavy bleeding that occurs during your period. This medicine may be used for other purposes; ask your health care provider or pharmacist if you have questions. COMMON BRAND NAME(S): Kyleena, LILETTA, Mirena, Skyla What should I tell my health care provider before I take this medicine? They need to know if you have any of these conditions: -abnormal Pap smear -cancer of the breast, uterus, or cervix -diabetes -endometritis -genital or pelvic infection now or in the past -have more than one sexual partner or your partner has more than one partner -heart disease -history of an ectopic or tubal pregnancy -immune system problems -IUD in place -liver disease or tumor -problems with blood clots or take blood-thinners -seizures -use intravenous drugs -uterus of unusual shape -vaginal bleeding that has not been explained -an unusual or allergic reaction  to levonorgestrel, other hormones, silicone, or polyethylene, medicines, foods, dyes, or preservatives -pregnant or trying to get pregnant -breast-feeding How should I use this medicine? This device is placed inside the uterus by a health care professional. Talk to your pediatrician regarding the use of this medicine in children. Special care may be needed. Overdosage: If you think you have taken too much of this medicine contact a poison control center or emergency room at once. NOTE: This medicine is only for you. Do not share this medicine with others. What if I miss a dose? This does not apply. Depending on the brand of device you have inserted, the device will need to be replaced every 3 to 5 years if you wish to continue using this type of birth control. What may interact with this medicine? Do not take this medicine with any of the following medications: -amprenavir -bosentan -fosamprenavir This medicine may also interact with the following medications: -aprepitant -armodafinil -barbiturate medicines for inducing sleep or treating seizures -bexarotene -boceprevir -griseofulvin -medicines to treat seizures like carbamazepine, ethotoin, felbamate, oxcarbazepine, phenytoin, topiramate -modafinil -pioglitazone -rifabutin -rifampin -rifapentine -some medicines to treat HIV infection like atazanavir, efavirenz, indinavir, lopinavir, nelfinavir, tipranavir, ritonavir -St. John's wort -warfarin This list may not describe all possible interactions. Give your health care provider a list of all the medicines, herbs, non-prescription drugs, or dietary supplements you use. Also tell them if you smoke, drink alcohol, or use illegal drugs. Some items may interact with your medicine. What should I watch for while using this medicine? Visit your doctor or health care   professional for regular check ups. See your doctor if you or your partner has sexual contact with others, becomes HIV positive,  or gets a sexual transmitted disease. This product does not protect you against HIV infection (AIDS) or other sexually transmitted diseases. You can check the placement of the IUD yourself by reaching up to the top of your vagina with clean fingers to feel the threads. Do not pull on the threads. It is a good habit to check placement after each menstrual period. Call your doctor right away if you feel more of the IUD than just the threads or if you cannot feel the threads at all. The IUD may come out by itself. You may become pregnant if the device comes out. If you notice that the IUD has come out use a backup birth control method like condoms and call your health care provider. Using tampons will not change the position of the IUD and are okay to use during your period. This IUD can be safely scanned with magnetic resonance imaging (MRI) only under specific conditions. Before you have an MRI, tell your healthcare provider that you have an IUD in place, and which type of IUD you have in place. What side effects may I notice from receiving this medicine? Side effects that you should report to your doctor or health care professional as soon as possible: -allergic reactions like skin rash, itching or hives, swelling of the face, lips, or tongue -fever, flu-like symptoms -genital sores -high blood pressure -no menstrual period for 6 weeks during use -pain, swelling, warmth in the leg -pelvic pain or tenderness -severe or sudden headache -signs of pregnancy -stomach cramping -sudden shortness of breath -trouble with balance, talking, or walking -unusual vaginal bleeding, discharge -yellowing of the eyes or skin Side effects that usually do not require medical attention (report to your doctor or health care professional if they continue or are bothersome): -acne -breast pain -change in sex drive or performance -changes in weight -cramping, dizziness, or faintness while the device is being  inserted -headache -irregular menstrual bleeding within first 3 to 6 months of use -nausea This list may not describe all possible side effects. Call your doctor for medical advice about side effects. You may report side effects to FDA at 1-800-FDA-1088. Where should I keep my medicine? This does not apply. NOTE: This sheet is a summary. It may not cover all possible information. If you have questions about this medicine, talk to your doctor, pharmacist, or health care provider.  2019 Elsevier/Gold Standard (2016-04-30 14:14:56)  

## 2019-01-08 NOTE — Progress Notes (Signed)
Amber Herrera is a 28 y.o. year old [id number] female who presents for placement of a Mirena IUD.  BP 104/61   Pulse 73   Ht 5\' 6"  (1.676 m)   Wt 127 lb 12.8 oz (58 kg)   LMP 02/13/2018 (Exact Date)   BMI 20.63 kg/m   Pregnancy test today was negative.   The risks and benefits of the method and placement have been thouroughly reviewed with the patient and all questions were answered.  Specifically the patient is aware of failure rate of 08/998, expulsion of the IUD and of possible perforation.  The patient is aware of irregular bleeding due to the method and understands the incidence of irregular bleeding diminishes with time.  Signed copy of informed consent in chart.   Time out was performed.  A small plastic speculum was placed in the vagina.  The cervix was visualized, prepped using Betadine, and grasped with a single tooth tenaculum. The uterus was sounded to 9 cm.  Mirena IUD placed per manufacturer's recommendations.   The strings were trimmed to 3 cm.  The patient was given post procedure instructions, including signs and symptoms of infection and to check for the strings after each menses or each month, and refraining from intercourse or anything in the vagina for 3 days.  She was given a Mirena care card with date Mirena placed, and date Mirena to be removed.  Reviewed red flag symptoms and when to call.   RTC x 6-8 weeks for IUD string check or sooner if needed.    Diona Fanti, CNM [facility], [facility] 01/08/19 9:40 AM   NDC: 38937-342-87 Lot: [id number] Exp: 12/2020
